# Patient Record
Sex: Male | Born: 1970 | Race: White | Hispanic: No | Marital: Single | Smoking: Former smoker
Health system: Southern US, Community
[De-identification: ages and names within clinical notes are randomized; demographics above are authoritative.]

## PROBLEM LIST (undated history)

## (undated) DIAGNOSIS — I509 Heart failure, unspecified: Secondary | ICD-10-CM

## (undated) HISTORY — DX: Heart failure, unspecified: I50.9

---

## 2017-06-26 ENCOUNTER — Inpatient Hospital Stay (HOSPITAL_COMMUNITY)
Admission: EM | Admit: 2017-06-26 | Discharge: 2017-06-29 | DRG: 286 | Disposition: A | Discharge status: Home / Self Care | Payer: 59 | Attending: Cardiovascular Disease | Admitting: Cardiovascular Disease

## 2017-06-26 ENCOUNTER — Other Ambulatory Visit: Payer: Self-pay

## 2017-06-26 ENCOUNTER — Emergency Department (HOSPITAL_COMMUNITY): Payer: 59

## 2017-06-26 ENCOUNTER — Encounter (HOSPITAL_COMMUNITY): Payer: Self-pay

## 2017-06-26 DIAGNOSIS — R7303 Prediabetes: Secondary | ICD-10-CM | POA: Diagnosis present

## 2017-06-26 DIAGNOSIS — N179 Acute kidney failure, unspecified: Secondary | ICD-10-CM | POA: Diagnosis present

## 2017-06-26 DIAGNOSIS — Z8249 Family history of ischemic heart disease and other diseases of the circulatory system: Secondary | ICD-10-CM

## 2017-06-26 DIAGNOSIS — Z6841 Body Mass Index (BMI) 40.0 and over, adult: Secondary | ICD-10-CM | POA: Diagnosis not present

## 2017-06-26 DIAGNOSIS — I509 Heart failure, unspecified: Secondary | ICD-10-CM | POA: Diagnosis present

## 2017-06-26 DIAGNOSIS — I5021 Acute systolic (congestive) heart failure: Secondary | ICD-10-CM | POA: Diagnosis present

## 2017-06-26 DIAGNOSIS — N182 Chronic kidney disease, stage 2 (mild): Secondary | ICD-10-CM | POA: Diagnosis present

## 2017-06-26 DIAGNOSIS — I130 Hypertensive heart and chronic kidney disease with heart failure and stage 1 through stage 4 chronic kidney disease, or unspecified chronic kidney disease: Secondary | ICD-10-CM | POA: Diagnosis present

## 2017-06-26 DIAGNOSIS — I160 Hypertensive urgency: Secondary | ICD-10-CM | POA: Diagnosis present

## 2017-06-26 DIAGNOSIS — Z87891 Personal history of nicotine dependence: Secondary | ICD-10-CM

## 2017-06-26 DIAGNOSIS — I10 Essential (primary) hypertension: Secondary | ICD-10-CM | POA: Diagnosis present

## 2017-06-26 DIAGNOSIS — R001 Bradycardia, unspecified: Secondary | ICD-10-CM | POA: Diagnosis not present

## 2017-06-26 DIAGNOSIS — R739 Hyperglycemia, unspecified: Secondary | ICD-10-CM | POA: Diagnosis present

## 2017-06-26 DIAGNOSIS — J90 Pleural effusion, not elsewhere classified: Secondary | ICD-10-CM

## 2017-06-26 DIAGNOSIS — I429 Cardiomyopathy, unspecified: Secondary | ICD-10-CM | POA: Diagnosis present

## 2017-06-26 DIAGNOSIS — E7439 Other disorders of intestinal carbohydrate absorption: Secondary | ICD-10-CM | POA: Diagnosis present

## 2017-06-26 DIAGNOSIS — I428 Other cardiomyopathies: Secondary | ICD-10-CM | POA: Diagnosis not present

## 2017-06-26 LAB — CBC
HEMATOCRIT: 42.8 % (ref 39.0–52.0)
Hemoglobin: 14.8 g/dL (ref 13.0–17.0)
MCH: 30.4 pg (ref 26.0–34.0)
MCHC: 34.6 g/dL (ref 30.0–36.0)
MCV: 87.9 fL (ref 78.0–100.0)
Platelets: 284 10*3/uL (ref 150–400)
RBC: 4.87 MIL/uL (ref 4.22–5.81)
RDW: 14.1 % (ref 11.5–15.5)
WBC: 11.3 10*3/uL — ABNORMAL HIGH (ref 4.0–10.5)

## 2017-06-26 LAB — CBC WITH DIFFERENTIAL/PLATELET
Basophils Absolute: 0.1 10*3/uL (ref 0.0–0.1)
Basophils Relative: 1 %
EOS ABS: 0.3 10*3/uL (ref 0.0–0.7)
EOS PCT: 4 %
HCT: 41.4 % (ref 39.0–52.0)
Hemoglobin: 14.1 g/dL (ref 13.0–17.0)
LYMPHS ABS: 3.4 10*3/uL (ref 0.7–4.0)
LYMPHS PCT: 38 %
MCH: 29.8 pg (ref 26.0–34.0)
MCHC: 34.1 g/dL (ref 30.0–36.0)
MCV: 87.5 fL (ref 78.0–100.0)
MONO ABS: 0.8 10*3/uL (ref 0.1–1.0)
MONOS PCT: 9 %
Neutro Abs: 4.4 10*3/uL (ref 1.7–7.7)
Neutrophils Relative %: 48 %
PLATELETS: 290 10*3/uL (ref 150–400)
RBC: 4.73 MIL/uL (ref 4.22–5.81)
RDW: 14.1 % (ref 11.5–15.5)
WBC: 8.9 10*3/uL (ref 4.0–10.5)

## 2017-06-26 LAB — COMPREHENSIVE METABOLIC PANEL
ALK PHOS: 46 U/L (ref 38–126)
ALT: 57 U/L (ref 17–63)
ANION GAP: 10 (ref 5–15)
AST: 31 U/L (ref 15–41)
Albumin: 3.8 g/dL (ref 3.5–5.0)
BUN: 24 mg/dL — ABNORMAL HIGH (ref 6–20)
CALCIUM: 9.2 mg/dL (ref 8.9–10.3)
CHLORIDE: 104 mmol/L (ref 101–111)
CO2: 25 mmol/L (ref 22–32)
CREATININE: 1.26 mg/dL — AB (ref 0.61–1.24)
Glucose, Bld: 119 mg/dL — ABNORMAL HIGH (ref 65–99)
Potassium: 3.6 mmol/L (ref 3.5–5.1)
SODIUM: 139 mmol/L (ref 135–145)
Total Bilirubin: 0.5 mg/dL (ref 0.3–1.2)
Total Protein: 7.1 g/dL (ref 6.5–8.1)

## 2017-06-26 LAB — URINALYSIS, ROUTINE W REFLEX MICROSCOPIC
Bilirubin Urine: NEGATIVE
Glucose, UA: NEGATIVE mg/dL
HGB URINE DIPSTICK: NEGATIVE
Ketones, ur: NEGATIVE mg/dL
Leukocytes, UA: NEGATIVE
NITRITE: NEGATIVE
Protein, ur: NEGATIVE mg/dL
SPECIFIC GRAVITY, URINE: 1.011 (ref 1.005–1.030)
pH: 7 (ref 5.0–8.0)

## 2017-06-26 LAB — GLUCOSE, CAPILLARY
GLUCOSE-CAPILLARY: 100 mg/dL — AB (ref 65–99)
GLUCOSE-CAPILLARY: 105 mg/dL — AB (ref 65–99)
Glucose-Capillary: 110 mg/dL — ABNORMAL HIGH (ref 65–99)

## 2017-06-26 LAB — BRAIN NATRIURETIC PEPTIDE: B NATRIURETIC PEPTIDE 5: 255.4 pg/mL — AB (ref 0.0–100.0)

## 2017-06-26 LAB — CREATININE, SERUM
Creatinine, Ser: 1.35 mg/dL — ABNORMAL HIGH (ref 0.61–1.24)
GFR calc Af Amer: >60 mL/min (ref >60)

## 2017-06-26 MED ORDER — AMLODIPINE BESYLATE 10 MG PO TABS
10.0000 mg | ORAL_TABLET | Freq: Every day | ORAL | Status: DC
  Administered 2017-06-26 – 2017-06-29 (×4): 10 mg via ORAL
  Filled 2017-06-26 (×4): qty 1

## 2017-06-26 MED ORDER — SODIUM CHLORIDE 0.9% FLUSH
3.0000 mL | INTRAVENOUS | Status: DC | PRN
Start: 2017-06-26 — End: 2017-06-29

## 2017-06-26 MED ORDER — MAGNESIUM SULFATE 2 GM/50ML IV SOLN
2.0000 g | Freq: Once | INTRAVENOUS | Status: AC
  Administered 2017-06-26: 2 g via INTRAVENOUS
  Filled 2017-06-26: qty 50

## 2017-06-26 MED ORDER — ACETAMINOPHEN 325 MG PO TABS
650.0000 mg | ORAL_TABLET | ORAL | Status: DC | PRN
  Administered 2017-06-26: 650 mg via ORAL
  Filled 2017-06-26: qty 2

## 2017-06-26 MED ORDER — DM-GUAIFENESIN ER 30-600 MG PO TB12: 1.0000 | ORAL_TABLET | Freq: Two times a day (BID) | ORAL | Status: DC | PRN

## 2017-06-26 MED ORDER — ONDANSETRON HCL 4 MG/2ML IJ SOLN: 4.0000 mg | Freq: Three times a day (TID) | INTRAMUSCULAR | Status: DC | PRN

## 2017-06-26 MED ORDER — ASPIRIN EC 81 MG PO TBEC
81.0000 mg | DELAYED_RELEASE_TABLET | Freq: Every day | ORAL | Status: DC
  Administered 2017-06-26 – 2017-06-29 (×4): 81 mg via ORAL
  Filled 2017-06-26 (×4): qty 1

## 2017-06-26 MED ORDER — ENOXAPARIN SODIUM 40 MG/0.4ML ~~LOC~~ SOLN
40.0000 mg | SUBCUTANEOUS | Status: DC
  Administered 2017-06-27 – 2017-06-29 (×2): 40 mg via SUBCUTANEOUS
  Filled 2017-06-26 (×2): qty 0.4

## 2017-06-26 MED ORDER — IPRATROPIUM BROMIDE 0.02 % IN SOLN
RESPIRATORY_TRACT | Status: AC
  Filled 2017-06-26: qty 2.5

## 2017-06-26 MED ORDER — ALBUTEROL SULFATE (2.5 MG/3ML) 0.083% IN NEBU: 2.5000 mg | INHALATION_SOLUTION | RESPIRATORY_TRACT | Status: DC | PRN

## 2017-06-26 MED ORDER — SODIUM CHLORIDE 0.9 % IV SOLN: 250.0000 mL | INTRAVENOUS | Status: DC | PRN

## 2017-06-26 MED ORDER — SODIUM CHLORIDE 0.9% FLUSH
3.0000 mL | Freq: Two times a day (BID) | INTRAVENOUS | Status: DC
  Administered 2017-06-26 – 2017-06-28 (×6): 3 mL via INTRAVENOUS

## 2017-06-26 MED ORDER — ALBUTEROL SULFATE (2.5 MG/3ML) 0.083% IN NEBU
5.0000 mg | INHALATION_SOLUTION | Freq: Once | RESPIRATORY_TRACT | Status: AC
  Administered 2017-06-26: 5 mg via RESPIRATORY_TRACT
  Filled 2017-06-26: qty 6

## 2017-06-26 MED ORDER — INSULIN ASPART 100 UNIT/ML ~~LOC~~ SOLN: 0.0000 [IU] | Freq: Three times a day (TID) | SUBCUTANEOUS | Status: DC

## 2017-06-26 MED ORDER — ALBUTEROL (5 MG/ML) CONTINUOUS INHALATION SOLN
INHALATION_SOLUTION | RESPIRATORY_TRACT | Status: AC
  Filled 2017-06-26: qty 1

## 2017-06-26 MED ORDER — HYDRALAZINE HCL 20 MG/ML IJ SOLN
20.0000 mg | Freq: Once | INTRAMUSCULAR | Status: AC
  Administered 2017-06-26: 20 mg via INTRAVENOUS
  Filled 2017-06-26: qty 1

## 2017-06-26 MED ORDER — FUROSEMIDE 10 MG/ML IJ SOLN
80.0000 mg | Freq: Once | INTRAMUSCULAR | Status: AC
  Administered 2017-06-26: 80 mg via INTRAVENOUS
  Filled 2017-06-26: qty 8

## 2017-06-26 MED ORDER — POTASSIUM CHLORIDE CRYS ER 10 MEQ PO TBCR
10.0000 meq | EXTENDED_RELEASE_TABLET | Freq: Two times a day (BID) | ORAL | Status: DC
  Administered 2017-06-26 – 2017-06-29 (×7): 10 meq via ORAL
  Filled 2017-06-26 (×7): qty 1

## 2017-06-26 MED ORDER — LISINOPRIL 5 MG PO TABS
5.0000 mg | ORAL_TABLET | Freq: Every day | ORAL | Status: DC
  Administered 2017-06-26: 5 mg via ORAL
  Filled 2017-06-26: qty 1

## 2017-06-26 MED ORDER — FUROSEMIDE 10 MG/ML IJ SOLN
40.0000 mg | Freq: Two times a day (BID) | INTRAMUSCULAR | Status: DC
  Administered 2017-06-26 – 2017-06-29 (×6): 40 mg via INTRAVENOUS
  Filled 2017-06-26 (×7): qty 4

## 2017-06-26 MED ORDER — CARVEDILOL 3.125 MG PO TABS
3.1250 mg | ORAL_TABLET | Freq: Two times a day (BID) | ORAL | Status: DC
  Administered 2017-06-26 – 2017-06-29 (×6): 3.125 mg via ORAL
  Filled 2017-06-26 (×6): qty 1

## 2017-06-26 MED ORDER — HYDRALAZINE HCL 20 MG/ML IJ SOLN: 5.0000 mg | INTRAMUSCULAR | Status: DC | PRN

## 2017-06-26 NOTE — Progress Notes (Signed)
This is a no charge note  Pending admission per Dr. Betsey Holiday  47 year old man with past medical history only significant for obesity, who presents with SOB for 10 days. Patient has cough and congestion. He took a course of Z-Pak without improvement. Denies chest pain. Patient has bilateral leg edema.  Patient was found to have BNP 255, blood pressure 179/105, acute renal injury with creatinine 1.26, chest x-ray showed pulmonary edema, clinically consistent with acute onset CHF. Patient may have undiagnosed hypertension. 80 mg Lasix was given. Started patient with amlodipine.   Ivor Costa, MD  [facility] Pager [phone]  If 7PM-7AM, please contact night-coverage [email or web address] Password [email or web address] 06/26/2017, 6:12 AM

## 2017-06-26 NOTE — ED Triage Notes (Addendum)
Pt. Complains of SOB for a week and a half. Pt. Reports thinking it was a cold and was prescribed a zpack. Pt. States he laid down to fall asleep and felt like he couldn't breathe. Pt. Reports bilateral lower extremity swelling.

## 2017-06-26 NOTE — ED Notes (Signed)
Pt. Return from XRAY via stretcher. 

## 2017-06-26 NOTE — ED Notes (Addendum)
Pt. States breathing treatment has made him feel better. Pt.  recent BP 179/105. MD notified.

## 2017-06-26 NOTE — ED Notes (Signed)
Pt. Started gasping for air and claimed he wasn't able to breathe. Put pt. On side of bed and started on 2 L. MD notified.

## 2017-06-26 NOTE — ED Notes (Signed)
Pt. To XRAY via stretcher. 

## 2017-06-26 NOTE — H&P (Signed)
History and Physical    Alvin Levy NID:[id number] DOB: 12-Jun-1971 DOA: 06/26/2017  PCP: Patient, No Pcp Per   Patient coming from: Home  Patient has no previous records in our system.  Chief Complaint: Acute severe shortness of breath for 1-1/2 weeks worsen this morning.  HPI: Alvin Levy is a 46 y.o. male with medical history significant for morbid obesity who presents to the emergency department complaining of severe shortness of breath. Patient has been short of breath for approximately a week. He had some cough and congestion which got worse. He contacted a telemedicine provider through his insurance company and was prescribed a Z-Pak. Improvement with the Z-Pak but in the last day or so has had increasing shortness of breath. He has decreased exercise tolerance, he is very short of breath with minimal exertion. He is unable to lie flat. He has paroxysmal nocturnal dyspnea. Patient arrived to our emergency department via ambulance. He has no associated chest pain. He denies any palpitations. He does have some new lower extremity edema.  Patient has never had a problem like this before. In the emergency department he was noted to be markedly hypertensive and he states that his blood pressure has always been slightly low. He denies any headache or blurry vision he has had no signs of high blood pressure including chest pain or pressure. He does complain of increased swelling of the legs and feet since the shortness of breath began. He has had severe dyspnea on exertion.  ED Course: In the emergency department patient was treated with IV Lasix with a 1400 mL diuresis. At the time I saw him he was feeling somewhat better. He'll be admitted into the hospital for further evaluation and management of new onset congestive heart failure.   Review of Systems  Constitutional: Negative for chills and fever.  HENT: Negative for congestion, nosebleeds and sinus pain.   Eyes: Negative for blurred vision and  double vision.  Respiratory: Positive for cough and shortness of breath. Negative for hemoptysis, sputum production and wheezing.   Cardiovascular: Positive for orthopnea, leg swelling and PND. Negative for chest pain, palpitations and claudication.  Gastrointestinal: Negative for abdominal pain, heartburn, nausea and vomiting.  Genitourinary: Negative for dysuria and urgency.  Musculoskeletal: Negative for falls, myalgias and neck pain.  Skin: Negative for itching and rash.  Neurological: Negative for dizziness, tingling, tremors, speech change, focal weakness, seizures, weakness and headaches.  Endo/Heme/Allergies: Negative for environmental allergies. Does not bruise/bleed easily.  Psychiatric/Behavioral: Negative for depression and suicidal ideas.   History reviewed. No pertinent past medical history.  History reviewed. No pertinent surgical history.   reports that he has quit smoking. He has quit using smokeless tobacco. He reports that he does not drink alcohol or use drugs.  No Known Allergies  Family History  Problem Relation Age of Onset  . Heart disease Mother   . HIV/AIDS Father      Prior to Admission medications   Medication Sig Start Date End Date Taking? Authorizing Provider  ibuprofen (ADVIL,MOTRIN) 200 MG tablet Take 200 mg every 6 (six) hours as needed by mouth for fever, headache or mild pain.   Yes [provider]    Physical Exam: Vitals:   06/26/17 0600 06/26/17 0645 06/26/17 0715 06/26/17 0836  BP: (!) 165/83 (!) 160/95 140/82 (!) 140/121  Pulse: 100 99 95 (!) 106  Resp: 19   18  Temp:    97.7 F (36.5 C)  TempSrc:      SpO2:  100% 97% 97% 97%  Weight:    (!) 147.8 kg (325 lb 14.4 oz)  Height:    6\' 4"  (1.93 m)    Constitutional: NAD, calm, comfortable Vitals:   06/26/17 0600 06/26/17 0645 06/26/17 0715 06/26/17 0836  BP: (!) 165/83 (!) 160/95 140/82 (!) 140/121  Pulse: 100 99 95 (!) 106  Resp: 19   18  Temp:    97.7 F (36.5 C)    TempSrc:      SpO2: 100% 97% 97% 97%  Weight:    (!) 147.8 kg (325 lb 14.4 oz)  Height:    6\' 4"  (1.93 m)   Eyes: PERRL, lids and conjunctivae normal ENMT: Mucous membranes are moist. Posterior pharynx clear of any exudate or lesions.Normal dentition.  Neck: normal, supple, no masses, no thyromegaly, no JVD or JVP. Respiratory: Coarse breath sounds bilaterally, minimal wheezing, crackles halfway up lung fields. Increased respiratory effort. No accessory muscle use.  Cardiovascular: Regular rate and rhythm, no murmurs / rubs / gallops. extremity edema 2+ to halfway up the calves. 2+ pedal pulses. No carotid bruits.  Abdomen: no tenderness, no masses palpated. No hepatosplenomegaly. Bowel sounds positive.  Musculoskeletal: no clubbing / cyanosis. No joint deformity upper and lower extremities. Good ROM, no contractures. Normal muscle tone.  Skin: no rashes, lesions, ulcers. No induration Neurologic: CN 2-12 grossly intact. Sensation intact, DTR normal. Strength 5/5 in all 4.  Psychiatric: Normal judgment and insight. Alert and oriented x 3. Normal mood.     Labs on Admission: I have personally reviewed following labs and imaging studies  CBC: Recent Labs  Lab 06/26/17 0304 06/26/17 0852  WBC 8.9 11.3*  NEUTROABS 4.4  --   HGB 14.1 14.8  HCT 41.4 42.8  MCV 87.5 87.9  PLT 290 371   Basic Metabolic Panel: Recent Labs  Lab 06/26/17 0304 06/26/17 0852  NA 139  --   K 3.6  --   CL 104  --   CO2 25  --   GLUCOSE 119*  --   BUN 24*  --   CREATININE 1.26* 1.35*  CALCIUM 9.2  --    GFR: Estimated Creatinine Clearance: 107.5 mL/min (A) (by C-G formula based on SCr of 1.35 mg/dL (H)). Liver Function Tests: Recent Labs  Lab 06/26/17 0304  AST 31  ALT 57  ALKPHOS 46  BILITOT 0.5  PROT 7.1  ALBUMIN 3.8   No results for input(s): LIPASE, AMYLASE in the last 168 hours. No results for input(s): AMMONIA in the last 168 hours. Coagulation Profile: No results for input(s):  INR, PROTIME in the last 168 hours. Cardiac Enzymes: No results for input(s): CKTOTAL, CKMB, CKMBINDEX, TROPONINI in the last 168 hours. BNP (last 3 results) No results for input(s): PROBNP in the last 8760 hours. HbA1C: No results for input(s): HGBA1C in the last 72 hours. CBG: Recent Labs  Lab 06/26/17 1159  GLUCAP 110*   Lipid Profile: No results for input(s): CHOL, HDL, LDLCALC, TRIG, CHOLHDL, LDLDIRECT in the last 72 hours. Thyroid Function Tests: No results for input(s): TSH, T4TOTAL, FREET4, T3FREE, THYROIDAB in the last 72 hours. Anemia Panel: No results for input(s): VITAMINB12, FOLATE, FERRITIN, TIBC, IRON, RETICCTPCT in the last 72 hours. Urine analysis: No results found for: COLORURINE, APPEARANCEUR, LABSPEC, PHURINE, GLUCOSEU, HGBUR, BILIRUBINUR, KETONESUR, PROTEINUR, UROBILINOGEN, NITRITE, LEUKOCYTESUR  Radiological Exams on Admission: Dg Chest 2 View  Result Date: 06/26/2017 CLINICAL DATA:  Shortness of breath. EXAM: CHEST  2 VIEW COMPARISON:  None. FINDINGS: Mild cardiomegaly.  Moderate pulmonary edema and bronchial thickening. Small right pleural effusion. No confluent airspace disease. No pneumothorax. No acute osseous abnormalities. IMPRESSION: Cardiomegaly with pulmonary edema and small right pleural effusion consistent with CHF. Electronically Signed   By: Jeb Levering M.D.   On: 06/26/2017 04:44    EKG: Independently reviewed. Sinus tachycardia with nonspecific ST-T wave abnormality and a prolonged QT no prior is available for comparison.  Assessment/Plan Principal Problem:   Acute CHF (congestive heart failure) (HCC) Active Problems:   Accelerated hypertension   Obesity, Class III, BMI 40-49.9 (morbid obesity) ([place])   AKI (acute kidney injury) ([place])   Hyperglycemia  1. Acute congestive heart failure: Patient will be admitted into the hospital for new onset acute congestive heart failure. He will need extensive evaluation and teaching. Start Lasix 40 mg  IV every 12 hours. Monitor renal function potassium and magnesium closely. Will begin a very low dose ACE inhibitor and beta blocker. We will check daily weights and start the patient on a low salt cardiac diet with a 1500 mL fluid restriction. Patient will receive CHF education as well as follow-up with cardiologist. He will also need to be established with a primary care physician.  2. Accelerated hypertension: Patient's blood pressures are markedly elevated and consistent with accelerated or emergent hypertension. Is likely the cause of his congestive heart failure. He was given a dose of amlodipine. Will continue amlodipine in addition to low-dose carvedilol and low-dose lisinopril 5 mg daily. Hydralazine has been ordered as needed for systolic blood pressures greater than 175.  3. Obesity class III with a BMI of 19-75.8: Patient will certainly need to get on a weight loss plan. He would benefit from a anti-inflammatory diet. Nutrition will see him and discuss with him at their consultation regarding his heart failure diet.  4. Acute kidney injury: In the face of accelerated hypertension with evidence of acute congestive heart failure this is very concerning. We'll check a urinalysis to rule out any acute kidney issues that can be reversed. We'll avoid nephrotoxic agents.  5. Hyperglycemia: Patient with moderately elevated blood glucose fasting of 119. He has marked morbid obesity and is at risk for diabetes based on his body surface area. Will check fingerstick blood glucoses before meals and at bedtime and cover with sliding scale if necessary. I will check a hemoglobin A1c.   DVT prophylaxis: Lovenox Code Status: Full code Family Communication: Patient's awake alert and has capacity to make decisions. No family present at the time of admission. Disposition Plan: Likely home in 48-72 hours Consults called: None Admission status: Inpatient   Lady Deutscher MD FACP [facility] Pager [phone]  If 7PM-7AM, please contact night-coverage [email or web address] Password [email or web address]  06/26/2017, 12:06 PM

## 2017-06-26 NOTE — ED Provider Notes (Addendum)
[facility] EMERGENCY DEPARTMENT Provider Note   CSN: [id number] Arrival date & time: 06/26/17  0626     History   Chief Complaint Chief Complaint  Patient presents with  . Shortness of Breath    HPI Alvin Levy is a 46 y.o. male.  Patient presents to the emergency department for evaluation of shortness of breath.  Patient reports that he has been experiencing shortness of breath for approximately a week.  He started with some cough and congestion.  He contacted a telemedicine provider and was prescribed a Z-Pak.  He reports that he had some improvement with the Z-Pak, but in the last day or so he has started having increased shortness of breath.  He has noticed that he has decreased exercise tolerance, gets very short of breath with minimal exertion.  He has brought to the ER from home by ambulance.  Patient denies associated chest pain.      History reviewed. No pertinent past medical history.  There are no active problems to display for this patient.   History reviewed. No pertinent surgical history.     Home Medications    Prior to Admission medications   Medication Sig Start Date End Date Taking? Authorizing Provider  ibuprofen (ADVIL,MOTRIN) 200 MG tablet Take 200 mg every 6 (six) hours as needed by mouth for fever, headache or mild pain.   Yes [provider]    Family History History reviewed. No pertinent family history.  Social History Social History   Tobacco Use  . Smoking status: Former Research scientist (life sciences)  . Smokeless tobacco: Former Network engineer Use Topics  . Alcohol use: Not on file  . Drug use: Not on file     Allergies   Patient has no known allergies.   Review of Systems Review of Systems  Respiratory: Positive for cough and shortness of breath.   All other systems reviewed and are negative.    Physical Exam Updated Vital Signs BP (!) 175/119   Pulse 93   Temp 98.3 F (36.8 C) (Oral)   Resp 19   Ht 6\' 4"   (1.93 m)   Wt 136.1 kg (300 lb)   SpO2 98%   BMI 36.52 kg/m   Physical Exam  Constitutional: He is oriented to person, place, and time. He appears well-developed and well-nourished. No distress.  HENT:  Head: Normocephalic and atraumatic.  Right Ear: Hearing normal.  Left Ear: Hearing normal.  Nose: Nose normal.  Mouth/Throat: Oropharynx is clear and moist and mucous membranes are normal.  Eyes: Conjunctivae and EOM are normal. Pupils are equal, round, and reactive to light.  Neck: Normal range of motion. Neck supple.  Cardiovascular: Regular rhythm, S1 normal and S2 normal. Exam reveals no gallop and no friction rub.  No murmur heard. Pulmonary/Chest: Effort normal. No respiratory distress. He has decreased breath sounds. He has wheezes. He exhibits no tenderness.  Abdominal: Soft. Normal appearance and bowel sounds are normal. There is no hepatosplenomegaly. There is no tenderness. There is no rebound, no guarding, no tenderness at McBurney's point and negative Murphy's sign. No hernia.  Musculoskeletal: Normal range of motion.  Neurological: He is alert and oriented to person, place, and time. He has normal strength. No cranial nerve deficit or sensory deficit. Coordination normal. GCS eye subscore is 4. GCS verbal subscore is 5. GCS motor subscore is 6.  Skin: Skin is warm, dry and intact. No rash noted. No cyanosis.  Psychiatric: He has a normal mood and affect.  His speech is normal and behavior is normal. Thought content normal.  Nursing note and vitals reviewed.    ED Treatments / Results  Labs (all labs ordered are listed, but only abnormal results are displayed) Labs Reviewed  COMPREHENSIVE METABOLIC PANEL - Abnormal; Notable for the following components:      Result Value   Glucose, Bld 119 (*)    BUN 24 (*)    Creatinine, Ser 1.26 (*)    All other components within normal limits  BRAIN NATRIURETIC PEPTIDE - Abnormal; Notable for the following components:   B  Natriuretic Peptide 255.4 (*)    All other components within normal limits  CBC WITH DIFFERENTIAL/PLATELET    EKG  EKG Interpretation  Date/Time:  Sunday June 26 2017 06:03:04 EST Ventricular Rate:  109 PR Interval:    QRS Duration: 98 QT Interval:  353 QTC Calculation: 476 R Axis:   60 Text Interpretation:  Sinus tachycardia Nonspecific T abnormalities, lateral leads Borderline prolonged QT interval Baseline wander in lead(s) I II aVR No previous tracing Confirmed by Orpah Greek [phone]) on 06/26/2017 6:04:51 AM       Radiology Dg Chest 2 View  Result Date: 06/26/2017 CLINICAL DATA:  Shortness of breath. EXAM: CHEST  2 VIEW COMPARISON:  None. FINDINGS: Mild cardiomegaly. Moderate pulmonary edema and bronchial thickening. Small right pleural effusion. No confluent airspace disease. No pneumothorax. No acute osseous abnormalities. IMPRESSION: Cardiomegaly with pulmonary edema and small right pleural effusion consistent with CHF. Electronically Signed   By: Jeb Levering M.D.   On: 06/26/2017 04:44    Procedures Procedures (including critical care time)  Medications Ordered in ED Medications  albuterol (PROVENTIL, VENTOLIN) (5 MG/ML) 0.5% continuous inhalation solution (not administered)  ipratropium (ATROVENT) 0.02 % nebulizer solution (not administered)  albuterol (PROVENTIL) (2.5 MG/3ML) 0.083% nebulizer solution 5 mg (5 mg Nebulization Given 06/26/17 0558)  furosemide (LASIX) injection 80 mg (80 mg Intravenous Given 06/26/17 0550)  hydrALAZINE (APRESOLINE) injection 20 mg (20 mg Intravenous Given 06/26/17 0550)     Initial Impression / Assessment and Plan / ED Course  I have reviewed the triage vital signs and the nursing notes.  Pertinent labs & imaging results that were available during my care of the patient were reviewed by me and considered in my medical decision making (see chart for details).     Patient presents to the emergency department for  evaluation of shortness of breath.  Patient reports that he has been experiencing progressively worsening shortness of breath.  He was recently treated for upper respiratory infection with a Z-Pak but did not improve much.  At arrival patient is noted to be very hypertensive.  He reports that his blood pressure in the past has always been slightly low.  He has not had any headaches, blurred vision, chest pain or other warning signs of high blood pressure.  He has, however, noticed increased swelling of his legs and feet since the shortness of breath began.  He is experiencing severe dyspnea on exertion as well as orthopnea.  His workup is suggestive of congestive heart failure.  He was initiated on Lasix.  Blood pressure is very elevated, likely hypertensive urgency.  Treat with hydralazine.  Patient will be hospitalized he has becomes very dyspneic and hypoxic with minimal exertion.  Final Clinical Impressions(s) / ED Diagnoses   Final diagnoses:  Acute congestive heart failure, unspecified heart failure type [facility])    New Prescriptions     Orpah Greek, MD 06/26/17  0500    Orpah Greek, MD 06/26/17 [phone]

## 2017-06-27 ENCOUNTER — Inpatient Hospital Stay (HOSPITAL_COMMUNITY): Payer: 59

## 2017-06-27 DIAGNOSIS — N182 Chronic kidney disease, stage 2 (mild): Secondary | ICD-10-CM

## 2017-06-27 DIAGNOSIS — I160 Hypertensive urgency: Secondary | ICD-10-CM

## 2017-06-27 DIAGNOSIS — I509 Heart failure, unspecified: Secondary | ICD-10-CM

## 2017-06-27 DIAGNOSIS — I5021 Acute systolic (congestive) heart failure: Secondary | ICD-10-CM

## 2017-06-27 DIAGNOSIS — I428 Other cardiomyopathies: Secondary | ICD-10-CM

## 2017-06-27 LAB — BASIC METABOLIC PANEL
ANION GAP: 10 (ref 5–15)
BUN: 23 mg/dL — AB (ref 6–20)
CALCIUM: 9.2 mg/dL (ref 8.9–10.3)
CO2: 29 mmol/L (ref 22–32)
Chloride: 102 mmol/L (ref 101–111)
Creatinine, Ser: 1.32 mg/dL — ABNORMAL HIGH (ref 0.61–1.24)
GFR calc Af Amer: >60 mL/min (ref >60)
Glucose, Bld: 118 mg/dL — ABNORMAL HIGH (ref 65–99)
POTASSIUM: 4.2 mmol/L (ref 3.5–5.1)
SODIUM: 141 mmol/L (ref 135–145)

## 2017-06-27 LAB — GLUCOSE, CAPILLARY
GLUCOSE-CAPILLARY: 85 mg/dL (ref 65–99)
Glucose-Capillary: 104 mg/dL — ABNORMAL HIGH (ref 65–99)

## 2017-06-27 LAB — ECHOCARDIOGRAM COMPLETE
Height: 76 in
WEIGHTICAEL: 5123.2 [oz_av]

## 2017-06-27 LAB — HIV ANTIBODY (ROUTINE TESTING W REFLEX): HIV Screen 4th Generation wRfx: NONREACTIVE

## 2017-06-27 LAB — HEMOGLOBIN A1C
Hgb A1c MFr Bld: 5.9 % — ABNORMAL HIGH (ref 4.8–5.6)
MEAN PLASMA GLUCOSE: 122.63 mg/dL

## 2017-06-27 LAB — TSH: TSH: 3.123 u[IU]/mL (ref 0.350–4.500)

## 2017-06-27 LAB — MAGNESIUM: MAGNESIUM: 2.3 mg/dL (ref 1.7–2.4)

## 2017-06-27 MED ORDER — SODIUM CHLORIDE 0.9% FLUSH: 3.0000 mL | INTRAVENOUS | Status: DC | PRN

## 2017-06-27 MED ORDER — SODIUM CHLORIDE 0.9 % IV SOLN
INTRAVENOUS | Status: DC
  Administered 2017-06-27: 15:00:00 via INTRAVENOUS

## 2017-06-27 MED ORDER — PERFLUTREN LIPID MICROSPHERE
1.0000 mL | INTRAVENOUS | Status: AC | PRN
  Administered 2017-06-27: 2 mL via INTRAVENOUS
  Filled 2017-06-27: qty 10

## 2017-06-27 MED ORDER — SODIUM CHLORIDE 0.9% FLUSH
3.0000 mL | Freq: Two times a day (BID) | INTRAVENOUS | Status: DC
  Administered 2017-06-27 – 2017-06-28 (×2): 3 mL via INTRAVENOUS

## 2017-06-27 MED ORDER — SODIUM CHLORIDE 0.9 % IV SOLN: 250.0000 mL | INTRAVENOUS | Status: DC | PRN

## 2017-06-27 NOTE — Progress Notes (Signed)
The on call MD called back and recommended that the morning MD will re-evaluate his betablocker before the next administration.

## 2017-06-27 NOTE — Consult Note (Signed)
Referring Physician:  [name] is an 46 y.o. male.                       Chief Complaint: Shortness of breath  HPI: 46 year old male presented with 2 week h/o of exertional shortness of breath. He denies chest pain. His  BNP is mildly elevated. His chest x-ray shows pulmonary edema. His LV function on echocardiogram done today shows wall motion abnormality with EF 35 %. He has h/o of untreated hypertension and has h/o prior smoking.  History reviewed. No pertinent past medical history.    History reviewed. No pertinent surgical history.  Family History  Problem Relation Age of Onset  . Heart disease Mother   . HIV/AIDS Father    Social History:  reports that he has quit smoking. He has quit using smokeless tobacco. He reports that he does not drink alcohol or use drugs.  Allergies: No Known Allergies  Medications Prior to Admission  Medication Sig Dispense Refill  . ibuprofen (ADVIL,MOTRIN) 200 MG tablet Take 200 mg every 6 (six) hours as needed by mouth for fever, headache or mild pain.      Results for orders placed or performed during the hospital encounter of 06/26/17 (from the past 48 hour(s))  CBC with Differential/Platelet     Status: None   Collection Time: 06/26/17  3:04 AM  Result Value Ref Range   WBC 8.9 4.0 - 10.5 K/uL   RBC 4.73 4.22 - 5.81 MIL/uL   Hemoglobin 14.1 13.0 - 17.0 g/dL   HCT 41.4 39.0 - 52.0 %   MCV 87.5 78.0 - 100.0 fL   MCH 29.8 26.0 - 34.0 pg   MCHC 34.1 30.0 - 36.0 g/dL   RDW 14.1 11.5 - 15.5 %   Platelets 290 150 - 400 K/uL   Neutrophils Relative % 48 %   Neutro Abs 4.4 1.7 - 7.7 K/uL   Lymphocytes Relative 38 %   Lymphs Abs 3.4 0.7 - 4.0 K/uL   Monocytes Relative 9 %   Monocytes Absolute 0.8 0.1 - 1.0 K/uL   Eosinophils Relative 4 %   Eosinophils Absolute 0.3 0.0 - 0.7 K/uL   Basophils Relative 1 %   Basophils Absolute 0.1 0.0 - 0.1 K/uL  Comprehensive metabolic panel     Status: Abnormal   Collection Time: 06/26/17  3:04 AM   Result Value Ref Range   Sodium 139 135 - 145 mmol/L   Potassium 3.6 3.5 - 5.1 mmol/L   Chloride 104 101 - 111 mmol/L   CO2 25 22 - 32 mmol/L   Glucose, Bld 119 (H) 65 - 99 mg/dL   BUN 24 (H) 6 - 20 mg/dL   Creatinine, Ser 1.26 (H) 0.61 - 1.24 mg/dL   Calcium 9.2 8.9 - 10.3 mg/dL   Total Protein 7.1 6.5 - 8.1 g/dL   Albumin 3.8 3.5 - 5.0 g/dL   AST 31 15 - 41 U/L   ALT 57 17 - 63 U/L   Alkaline Phosphatase 46 38 - 126 U/L   Total Bilirubin 0.5 0.3 - 1.2 mg/dL   GFR calc non Af Amer >60 >60 mL/min   GFR calc Af Amer >60 >60 mL/min    Comment: (NOTE) The eGFR has been calculated using the CKD EPI equation. This calculation has not been validated in all clinical situations. eGFR's persistently <60 mL/min signify possible Chronic Kidney Disease.    Anion gap 10 5 - 15  Brain  natriuretic peptide     Status: Abnormal   Collection Time: 06/26/17  3:40 AM  Result Value Ref Range   B Natriuretic Peptide 255.4 (H) 0.0 - 100.0 pg/mL  HIV antibody (Routine Testing)     Status: None   Collection Time: 06/26/17  8:52 AM  Result Value Ref Range   HIV Screen 4th Generation wRfx Non Reactive Non Reactive    Comment: (NOTE) Performed At: [facility] [place], [place] [id number] Rush Farmer MD HY:[id number]   CBC     Status: Abnormal   Collection Time: 06/26/17  8:52 AM  Result Value Ref Range   WBC 11.3 (H) 4.0 - 10.5 K/uL   RBC 4.87 4.22 - 5.81 MIL/uL   Hemoglobin 14.8 13.0 - 17.0 g/dL   HCT 42.8 39.0 - 52.0 %   MCV 87.9 78.0 - 100.0 fL   MCH 30.4 26.0 - 34.0 pg   MCHC 34.6 30.0 - 36.0 g/dL   RDW 14.1 11.5 - 15.5 %   Platelets 284 150 - 400 K/uL  Creatinine, serum     Status: Abnormal   Collection Time: 06/26/17  8:52 AM  Result Value Ref Range   Creatinine, Ser 1.35 (H) 0.61 - 1.24 mg/dL   GFR calc non Af Amer >60 >60 mL/min   GFR calc Af Amer >60 >60 mL/min    Comment: (NOTE) The eGFR has been calculated using the CKD EPI equation. This  calculation has not been validated in all clinical situations. eGFR's persistently <60 mL/min signify possible Chronic Kidney Disease.   Glucose, capillary     Status: Abnormal   Collection Time: 06/26/17 11:59 AM  Result Value Ref Range   Glucose-Capillary 110 (H) 65 - 99 mg/dL   Comment 1 Notify RN    Comment 2 Document in Chart   Urinalysis, Routine w reflex microscopic     Status: Abnormal   Collection Time: 06/26/17  1:27 PM  Result Value Ref Range   Color, Urine STRAW (A) YELLOW   APPearance CLEAR CLEAR   Specific Gravity, Urine 1.011 1.005 - 1.030   pH 7.0 5.0 - 8.0   Glucose, UA NEGATIVE NEGATIVE mg/dL   Hgb urine dipstick NEGATIVE NEGATIVE   Bilirubin Urine NEGATIVE NEGATIVE   Ketones, ur NEGATIVE NEGATIVE mg/dL   Protein, ur NEGATIVE NEGATIVE mg/dL   Nitrite NEGATIVE NEGATIVE   Leukocytes, UA NEGATIVE NEGATIVE  Glucose, capillary     Status: Abnormal   Collection Time: 06/26/17  4:35 PM  Result Value Ref Range   Glucose-Capillary 100 (H) 65 - 99 mg/dL   Comment 1 Notify RN    Comment 2 Document in Chart   Glucose, capillary     Status: Abnormal   Collection Time: 06/26/17  9:07 PM  Result Value Ref Range   Glucose-Capillary 105 (H) 65 - 99 mg/dL  Basic metabolic panel     Status: Abnormal   Collection Time: 06/27/17  3:10 AM  Result Value Ref Range   Sodium 141 135 - 145 mmol/L   Potassium 4.2 3.5 - 5.1 mmol/L   Chloride 102 101 - 111 mmol/L   CO2 29 22 - 32 mmol/L   Glucose, Bld 118 (H) 65 - 99 mg/dL   BUN 23 (H) 6 - 20 mg/dL   Creatinine, Ser 1.32 (H) 0.61 - 1.24 mg/dL   Calcium 9.2 8.9 - 10.3 mg/dL   GFR calc non Af Amer >60 >60 mL/min   GFR calc Af Amer >60 >60 mL/min  Comment: (NOTE) The eGFR has been calculated using the CKD EPI equation. This calculation has not been validated in all clinical situations. eGFR's persistently <60 mL/min signify possible Chronic Kidney Disease.    Anion gap 10 5 - 15  Magnesium     Status: None   Collection Time:  06/27/17  3:10 AM  Result Value Ref Range   Magnesium 2.3 1.7 - 2.4 mg/dL  Glucose, capillary     Status: Abnormal   Collection Time: 06/27/17  7:47 AM  Result Value Ref Range   Glucose-Capillary 104 (H) 65 - 99 mg/dL   Comment 1 Notify RN    Comment 2 Document in Chart   Hemoglobin A1c     Status: Abnormal   Collection Time: 06/27/17  9:11 AM  Result Value Ref Range   Hgb A1c MFr Bld 5.9 (H) 4.8 - 5.6 %    Comment: (NOTE) Pre diabetes:          5.7%-6.4% Diabetes:              >6.4% Glycemic control for   <7.0% adults with diabetes    Mean Plasma Glucose 122.63 mg/dL  TSH     Status: None   Collection Time: 06/27/17  9:11 AM  Result Value Ref Range   TSH 3.123 0.350 - 4.500 uIU/mL    Comment: Performed by a 3rd Generation assay with a functional sensitivity of <=0.01 uIU/mL.  Glucose, capillary     Status: None   Collection Time: 06/27/17 11:44 AM  Result Value Ref Range   Glucose-Capillary 85 65 - 99 mg/dL   Comment 1 Notify RN    Comment 2 Document in Chart    Dg Chest 2 View  Result Date: 06/26/2017 CLINICAL DATA:  Shortness of breath. EXAM: CHEST  2 VIEW COMPARISON:  None. FINDINGS: Mild cardiomegaly. Moderate pulmonary edema and bronchial thickening. Small right pleural effusion. No confluent airspace disease. No pneumothorax. No acute osseous abnormalities. IMPRESSION: Cardiomegaly with pulmonary edema and small right pleural effusion consistent with CHF. Electronically Signed   By: Jeb Levering M.D.   On: 06/26/2017 04:44   Dg Chest Port 1 View  Result Date: 06/27/2017 CLINICAL DATA:  Shortness of breath. EXAM: PORTABLE CHEST 1 VIEW COMPARISON:  Radiographs of June 26, 2017. FINDINGS: The heart size and mediastinal contours are within normal limits. No pneumothorax or pleural effusion is noted. Mild bilateral pulmonary edema is noted which is improved compared to prior exam. The visualized skeletal structures are unremarkable. IMPRESSION: Improved bilateral  pulmonary edema. Electronically Signed   By: Marijo Conception, M.D.   On: 06/27/2017 07:59    Review Of Systems Constitutional: No fever, chills, weight loss or gain. Eyes: No vision change, wears glasses. No discharge or pain. Ears: No hearing loss, No tinnitus. Respiratory: No asthma, COPD, pneumonias. Positive shortness of breath. No hemoptysis. Cardiovascular: No chest pain, palpitation, positive leg edema. Gastrointestinal: No nausea, vomiting, diarrhea, constipation. No GI bleed. No hepatitis. Genitourinary: No dysuria, hematuria, kidney stone. No incontinance. Neurological: No headache, stroke, seizures.  Psychiatry: No psych facility admission for anxiety, depression, suicide. No detox. Skin: No rash. Musculoskeletal: No joint pain, fibromyalgia. No neck pain, back pain. Lymphadenopathy: No lymphadenopathy. Hematology: No anemia or easy bruising.   Blood pressure 135/78, pulse 99, temperature 98.2 F (36.8 C), temperature source Oral, resp. rate 18, height 6' 4"  (1.93 m), weight (!) 145.2 kg (320 lb 3.2 oz), SpO2 98 %. Body mass index is 38.98 kg/m. General appearance: alert,  cooperative, appears stated age and no distress Head: Normocephalic, atraumatic. Eyes: Blue eyes, pink conjunctiva, corneas clear. PERRL, EOM's intact. Neck: No adenopathy, no carotid bruit, no JVD, supple, symmetrical, trachea midline and thyroid not enlarged. 2'' mobile, non-tender swelling left lateral aspect of neck and 1" mobile swelling below the right ear lobe area. Resp: Clearing to auscultation bilaterally. Cardio: Regular rate and rhythm, S1, S2 normal, II/VI systolic murmur, no click, rub or gallop GI: Soft, non-tender; bowel sounds normal; no organomegaly. Extremities: 1 + edema, cyanosis or clubbing. Skin: Warm and dry.  Neurologic: Alert and oriented X 3, normal strength. Normal coordination and gait.  Assessment/Plan Acute left heart systolic failure. Hypertension H/O tobacco use  disorder. Acute kidney injury-improving Morbid obesity  R + L Heart catheterization in AM. Continue diuresis.  Birdie Riddle, MD  06/27/2017, 3:00 PM

## 2017-06-27 NOTE — Progress Notes (Signed)
Patient has watched educational video regarding cardiac catheterization. No questions or concerns at this time. Consent signed and place in pt chart.

## 2017-06-27 NOTE — Progress Notes (Signed)
PROGRESS NOTE   Alvin Levy  EVO:[id number]    DOB: 1971/04/10    DOA: 06/26/2017  PCP: Patient, No Pcp Per   I have briefly reviewed patients previous medical records in [facility].  Brief Narrative:  46 year old single male, works at [employer], [place] of "borderline hypertension" 5 years ago and since then has not seen many physicians, former smoker, presented to ED with 2 weeks history of progressive chest congestion, dyspnea on exertion which progressed to at rest, orthopnea, PND, lower extremity edema but no chest pain, dizziness or lightheadedness. Seen by tele medicine provider and treated for presumed respiratory illness with Z-Pak without improvement. In ED, markedly hypertensive. Admitted for acute systolic CHF. 2-D echo shows LVEF 30-35 percent and severe global hypokinesis. Cardiology/Dr. Doylene Canard consulted.   Assessment & Plan:   Principal Problem:   Acute CHF (congestive heart failure) (HCC) Active Problems:   Accelerated hypertension   AKI (acute kidney injury) ([place])   Hyperglycemia   Obesity, Class III, BMI 40-49.9 (morbid obesity) ([place])   Acute systolic CHF - Likely related to poorly controlled long-standing hypertension, hypertensive heart disease and nonischemic cardiomyopathy. - 2-D echo 06/27/17: LVEF 30-35% and severe global hypokinesis. - Started on Lasix 40 mg IV every 12 hours. -5.17 L since admission. Weight is down from 325 pounds >320 pounds. Continue currently Lasix. - Lisinopril that was started yesterday, held due to bump in creatinine from 1.26 > 1.3 to and at risk for worsening during aggressive diuresis. Should add ACEI or ARB going forward for his cardiomyopathy. - Cardiology consulted.  Cardiomyopathy - Likely nonischemic due to poorly controlled hypertension. - Consider ACEI or ARB when volume status is slightly better. - Cardiology consulted and will await their input regarding need for ischemic evaluation. - TSH 3.123.  Transient bradycardia -  Noted transient bradycardia at 37 bpm on 06/27/17 at 6:04 AM.? Mobitz type I second-degree AV block versus rule out higher degree AV block. Discussed and consulted with Cardiology, await input. - Asymptomatic.  Hypertensive urgency - Blood pressure in ED on arrival was as high as 169/116. - Amlodipine 10 MG daily and carvedilol 3.125 MG twice daily started. Blood pressure control much improved. Lisinopril stopped for now. - Patient was counseled extensively regarding the need for PCP follow-up and healthy lifestyle to address multiple medical issues. He verbalized understanding.  Stage II chronic kidney disease - No prior creatinine to compare. Presented with creatinine of 1.26 which has gone up to 1.32. Monitor closely while on aggressive IV diuretics.  Glucose intolerance/prediabetes - A1c 5.9. CBGs currently well controlled. DC SSI.  Morbid obesity/Body mass index is 38.98 kg/m. - Needs to diet, exercise and lose weight.  Former smoker - Quit 200 days ago. Prior to that smoked a pack per day for 20 years. Compression related him on his recovery.  Health maintenance Consulted case management for assistance with new PCP arrangement   DVT prophylaxis: Lovenox Code Status: Full Family Communication: None at bedside Disposition: DC home when medically improved, possibly in the next 2-3 days.   Consultants:  Cardiology/Dr. Doylene Canard   Procedures:  Echo  Antimicrobials:  None    Subjective: Feels much better. Dyspnea significantly improved. Leg swelling decreasing. No chest pain, palpitations, dizziness or lightheadedness.  ROS: States that he usually ambulates about 2 miles per day but lately noted dyspnea on exertion and had to stop 3-4 times to complete that distance and even developed dyspnea at rest, orthopnea and PND. Chest congestion and inability to bring  up phlegm. Progressive leg swelling.  Objective:  Vitals:   06/26/17 1200 06/26/17 1935 06/27/17 0641 06/27/17  1325  BP: (!) 117/56 120/82 131/82 135/78  Pulse: (!) 101 (!) 102 (!) 106 99  Resp: 18 18 18 18   Temp: 98 F (36.7 C) 97.8 F (36.6 C) 97.8 F (36.6 C) 98.2 F (36.8 C)  TempSrc: Oral Oral Oral Oral  SpO2: 96% 98% 98% 98%  Weight:   (!) 145.2 kg (320 lb 3.2 oz)   Height:        Examination:  General exam: Pleasant young male, moderately built and morbidly obese, sitting up comfortably in chair this morning Respiratory system: Occasional bibasal crackles but otherwise clear to auscultation. Respiratory effort normal. Cardiovascular system: S1 & S2 heard, RRR. No JVD, murmurs, rubs, gallops or clicks. 2+ pitting bilateral leg edema. Telemetry: Mostly sinus rhythm. Intermittent sinus tachycardia in the 110s-120s. Transient bradycardia up to 37 bpm on 06/27/17 at 6:04 AM? Mobitz type I second-degree AV block versus concern for higher degree AV block. Gastrointestinal system: Abdomen is nondistended, soft and nontender. No organomegaly or masses felt. Normal bowel sounds heard. Central nervous system: Alert and oriented. No focal neurological deficits. Extremities: Symmetric 5 x 5 power. Skin: No rashes, lesions or ulcers Psychiatry: Judgement and insight appear normal. Mood & affect appropriate.     Data Reviewed: I have personally reviewed following labs and imaging studies  CBC: Recent Labs  Lab 06/26/17 0304 06/26/17 0852  WBC 8.9 11.3*  NEUTROABS 4.4  --   HGB 14.1 14.8  HCT 41.4 42.8  MCV 87.5 87.9  PLT 290 941   Basic Metabolic Panel: Recent Labs  Lab 06/26/17 0304 06/26/17 0852 06/27/17 0310  NA 139  --  141  K 3.6  --  4.2  CL 104  --  102  CO2 25  --  29  GLUCOSE 119*  --  118*  BUN 24*  --  23*  CREATININE 1.26* 1.35* 1.32*  CALCIUM 9.2  --  9.2  MG  --   --  2.3   Liver Function Tests: Recent Labs  Lab 06/26/17 0304  AST 31  ALT 57  ALKPHOS 46  BILITOT 0.5  PROT 7.1  ALBUMIN 3.8    HbA1C: Recent Labs    06/27/17 0911  HGBA1C 5.9*    CBG: Recent Labs  Lab 06/26/17 1159 06/26/17 1635 06/26/17 2107 06/27/17 0747 06/27/17 1144  GLUCAP 110* 100* 105* 104* 85    No results found for this or any previous visit (from the past 240 hour(s)).       Radiology Studies: Dg Chest 2 View  Result Date: 06/26/2017 CLINICAL DATA:  Shortness of breath. EXAM: CHEST  2 VIEW COMPARISON:  None. FINDINGS: Mild cardiomegaly. Moderate pulmonary edema and bronchial thickening. Small right pleural effusion. No confluent airspace disease. No pneumothorax. No acute osseous abnormalities. IMPRESSION: Cardiomegaly with pulmonary edema and small right pleural effusion consistent with CHF. Electronically Signed   By: Jeb Levering M.D.   On: 06/26/2017 04:44   Dg Chest Port 1 View  Result Date: 06/27/2017 CLINICAL DATA:  Shortness of breath. EXAM: PORTABLE CHEST 1 VIEW COMPARISON:  Radiographs of June 26, 2017. FINDINGS: The heart size and mediastinal contours are within normal limits. No pneumothorax or pleural effusion is noted. Mild bilateral pulmonary edema is noted which is improved compared to prior exam. The visualized skeletal structures are unremarkable. IMPRESSION: Improved bilateral pulmonary edema. Electronically Signed   By: Sabino Dick  Brooke Bonito, M.D.   On: 06/27/2017 07:59        Scheduled Meds: . amLODipine  10 mg Oral Daily  . aspirin EC  81 mg Oral Daily  . carvedilol  3.125 mg Oral BID WC  . enoxaparin (LOVENOX) injection  40 mg Subcutaneous Q24H  . furosemide  40 mg Intravenous Q12H  . insulin aspart  0-20 Units Subcutaneous TID WC  . potassium chloride  10 mEq Oral BID  . sodium chloride flush  3 mL Intravenous Q12H   Continuous Infusions: . sodium chloride       LOS: 1 day     Yoshiaki Kreuser, MD, FACP, FHM. [facility] Pager [phone]  If 7PM-7AM, please contact night-coverage [email or web address] Password [email or web address] 06/27/2017, 1:56 PM

## 2017-06-27 NOTE — Progress Notes (Signed)
Pt had 2.17 seconds pulse, and his HR drops as low as in the 30's that does not sustain. Pt denies any palpitations or any discomfort. MD on call has been paged, and are waiting for his response. Currently, pt's HR is in the 80's. Will continue to monitor.

## 2017-06-27 NOTE — Progress Notes (Signed)
Nutrition Education Note  RD consulted for nutrition education regarding new onset CHF.  RD provided "Low Sodium Nutrition Therapy" handout from the Academy of [employer]. Reviewed patient's dietary recall. Provided examples on ways to decrease sodium intake in diet. Discouraged intake of processed foods and use of salt shaker. Encouraged fresh fruits and vegetables as well as whole grain sources of carbohydrates to maximize fiber intake.   Pt reports he is ready to change. Pt cooks on weekends however is busy during the week so has been leaning on prepared foods, such as pringle's, corn dogs and chicken nuggets. Discussed with pt about the possibility of preparing food for the week and reheating during the workday, pt seems agreeable. Pt and RD discussed reading the food labels and the number/servings to look for.   RD discussed why it is important for patient to adhere to diet recommendations, and emphasized the role of fluids, foods to avoid, and importance of weighing self daily. Teach back method used.  Expect good compliance.  Body mass index is 38.98 kg/m. Pt meets criteria for obese based on current BMI.  Current diet order is 2 g sodium diet, patient is consuming approximately 100% of meals at this time. Pt reports no recent weight changes and that it has been stable over the past year.   Labs and medications reviewed. No further nutrition interventions warranted at this time. RD contact information provided. If additional nutrition issues arise, please re-consult RD.   Parks Ranger, MS, RDN, LDN 06/27/2017 4:33 PM

## 2017-06-27 NOTE — Progress Notes (Signed)
  Echocardiogram 2D Echocardiogram has been performed.  Alvin Levy 06/27/2017, 12:45 PM

## 2017-06-27 NOTE — H&P (View-Only) (Signed)
Referring Physician:  [name] is an 46 y.o. male.                       Chief Complaint: Shortness of breath  HPI: 46 year old male presented with 2 week h/o of exertional shortness of breath. He denies chest pain. His  BNP is mildly elevated. His chest x-ray shows pulmonary edema. His LV function on echocardiogram done today shows wall motion abnormality with EF 35 %. He has h/o of untreated hypertension and has h/o prior smoking.  History reviewed. No pertinent past medical history.    History reviewed. No pertinent surgical history.  Family History  Problem Relation Age of Onset  . Heart disease Mother   . HIV/AIDS Father    Social History:  reports that he has quit smoking. He has quit using smokeless tobacco. He reports that he does not drink alcohol or use drugs.  Allergies: No Known Allergies  Medications Prior to Admission  Medication Sig Dispense Refill  . ibuprofen (ADVIL,MOTRIN) 200 MG tablet Take 200 mg every 6 (six) hours as needed by mouth for fever, headache or mild pain.      Results for orders placed or performed during the hospital encounter of 06/26/17 (from the past 48 hour(s))  CBC with Differential/Platelet     Status: None   Collection Time: 06/26/17  3:04 AM  Result Value Ref Range   WBC 8.9 4.0 - 10.5 K/uL   RBC 4.73 4.22 - 5.81 MIL/uL   Hemoglobin 14.1 13.0 - 17.0 g/dL   HCT 41.4 39.0 - 52.0 %   MCV 87.5 78.0 - 100.0 fL   MCH 29.8 26.0 - 34.0 pg   MCHC 34.1 30.0 - 36.0 g/dL   RDW 14.1 11.5 - 15.5 %   Platelets 290 150 - 400 K/uL   Neutrophils Relative % 48 %   Neutro Abs 4.4 1.7 - 7.7 K/uL   Lymphocytes Relative 38 %   Lymphs Abs 3.4 0.7 - 4.0 K/uL   Monocytes Relative 9 %   Monocytes Absolute 0.8 0.1 - 1.0 K/uL   Eosinophils Relative 4 %   Eosinophils Absolute 0.3 0.0 - 0.7 K/uL   Basophils Relative 1 %   Basophils Absolute 0.1 0.0 - 0.1 K/uL  Comprehensive metabolic panel     Status: Abnormal   Collection Time: 06/26/17  3:04 AM   Result Value Ref Range   Sodium 139 135 - 145 mmol/L   Potassium 3.6 3.5 - 5.1 mmol/L   Chloride 104 101 - 111 mmol/L   CO2 25 22 - 32 mmol/L   Glucose, Bld 119 (H) 65 - 99 mg/dL   BUN 24 (H) 6 - 20 mg/dL   Creatinine, Ser 1.26 (H) 0.61 - 1.24 mg/dL   Calcium 9.2 8.9 - 10.3 mg/dL   Total Protein 7.1 6.5 - 8.1 g/dL   Albumin 3.8 3.5 - 5.0 g/dL   AST 31 15 - 41 U/L   ALT 57 17 - 63 U/L   Alkaline Phosphatase 46 38 - 126 U/L   Total Bilirubin 0.5 0.3 - 1.2 mg/dL   GFR calc non Af Amer >60 >60 mL/min   GFR calc Af Amer >60 >60 mL/min    Comment: (NOTE) The eGFR has been calculated using the CKD EPI equation. This calculation has not been validated in all clinical situations. eGFR's persistently <60 mL/min signify possible Chronic Kidney Disease.    Anion gap 10 5 - 15  Brain  natriuretic peptide     Status: Abnormal   Collection Time: 06/26/17  3:40 AM  Result Value Ref Range   B Natriuretic Peptide 255.4 (H) 0.0 - 100.0 pg/mL  HIV antibody (Routine Testing)     Status: None   Collection Time: 06/26/17  8:52 AM  Result Value Ref Range   HIV Screen 4th Generation wRfx Non Reactive Non Reactive    Comment: (NOTE) Performed At: [facility] [place], [place] [id number] Rush Farmer MD [id number]   CBC     Status: Abnormal   Collection Time: 06/26/17  8:52 AM  Result Value Ref Range   WBC 11.3 (H) 4.0 - 10.5 K/uL   RBC 4.87 4.22 - 5.81 MIL/uL   Hemoglobin 14.8 13.0 - 17.0 g/dL   HCT 42.8 39.0 - 52.0 %   MCV 87.9 78.0 - 100.0 fL   MCH 30.4 26.0 - 34.0 pg   MCHC 34.6 30.0 - 36.0 g/dL   RDW 14.1 11.5 - 15.5 %   Platelets 284 150 - 400 K/uL  Creatinine, serum     Status: Abnormal   Collection Time: 06/26/17  8:52 AM  Result Value Ref Range   Creatinine, Ser 1.35 (H) 0.61 - 1.24 mg/dL   GFR calc non Af Amer >60 >60 mL/min   GFR calc Af Amer >60 >60 mL/min    Comment: (NOTE) The eGFR has been calculated using the CKD EPI equation. This  calculation has not been validated in all clinical situations. eGFR's persistently <60 mL/min signify possible Chronic Kidney Disease.   Glucose, capillary     Status: Abnormal   Collection Time: 06/26/17 11:59 AM  Result Value Ref Range   Glucose-Capillary 110 (H) 65 - 99 mg/dL   Comment 1 Notify RN    Comment 2 Document in Chart   Urinalysis, Routine w reflex microscopic     Status: Abnormal   Collection Time: 06/26/17  1:27 PM  Result Value Ref Range   Color, Urine STRAW (A) YELLOW   APPearance CLEAR CLEAR   Specific Gravity, Urine 1.011 1.005 - 1.030   pH 7.0 5.0 - 8.0   Glucose, UA NEGATIVE NEGATIVE mg/dL   Hgb urine dipstick NEGATIVE NEGATIVE   Bilirubin Urine NEGATIVE NEGATIVE   Ketones, ur NEGATIVE NEGATIVE mg/dL   Protein, ur NEGATIVE NEGATIVE mg/dL   Nitrite NEGATIVE NEGATIVE   Leukocytes, UA NEGATIVE NEGATIVE  Glucose, capillary     Status: Abnormal   Collection Time: 06/26/17  4:35 PM  Result Value Ref Range   Glucose-Capillary 100 (H) 65 - 99 mg/dL   Comment 1 Notify RN    Comment 2 Document in Chart   Glucose, capillary     Status: Abnormal   Collection Time: 06/26/17  9:07 PM  Result Value Ref Range   Glucose-Capillary 105 (H) 65 - 99 mg/dL  Basic metabolic panel     Status: Abnormal   Collection Time: 06/27/17  3:10 AM  Result Value Ref Range   Sodium 141 135 - 145 mmol/L   Potassium 4.2 3.5 - 5.1 mmol/L   Chloride 102 101 - 111 mmol/L   CO2 29 22 - 32 mmol/L   Glucose, Bld 118 (H) 65 - 99 mg/dL   BUN 23 (H) 6 - 20 mg/dL   Creatinine, Ser 1.32 (H) 0.61 - 1.24 mg/dL   Calcium 9.2 8.9 - 10.3 mg/dL   GFR calc non Af Amer >60 >60 mL/min   GFR calc Af Amer >60 >60 mL/min  Comment: (NOTE) The eGFR has been calculated using the CKD EPI equation. This calculation has not been validated in all clinical situations. eGFR's persistently <60 mL/min signify possible Chronic Kidney Disease.    Anion gap 10 5 - 15  Magnesium     Status: None   Collection Time:  06/27/17  3:10 AM  Result Value Ref Range   Magnesium 2.3 1.7 - 2.4 mg/dL  Glucose, capillary     Status: Abnormal   Collection Time: 06/27/17  7:47 AM  Result Value Ref Range   Glucose-Capillary 104 (H) 65 - 99 mg/dL   Comment 1 Notify RN    Comment 2 Document in Chart   Hemoglobin A1c     Status: Abnormal   Collection Time: 06/27/17  9:11 AM  Result Value Ref Range   Hgb A1c MFr Bld 5.9 (H) 4.8 - 5.6 %    Comment: (NOTE) Pre diabetes:          5.7%-6.4% Diabetes:              >6.4% Glycemic control for   <7.0% adults with diabetes    Mean Plasma Glucose 122.63 mg/dL  TSH     Status: None   Collection Time: 06/27/17  9:11 AM  Result Value Ref Range   TSH 3.123 0.350 - 4.500 uIU/mL    Comment: Performed by a 3rd Generation assay with a functional sensitivity of <=0.01 uIU/mL.  Glucose, capillary     Status: None   Collection Time: 06/27/17 11:44 AM  Result Value Ref Range   Glucose-Capillary 85 65 - 99 mg/dL   Comment 1 Notify RN    Comment 2 Document in Chart    Dg Chest 2 View  Result Date: 06/26/2017 CLINICAL DATA:  Shortness of breath. EXAM: CHEST  2 VIEW COMPARISON:  None. FINDINGS: Mild cardiomegaly. Moderate pulmonary edema and bronchial thickening. Small right pleural effusion. No confluent airspace disease. No pneumothorax. No acute osseous abnormalities. IMPRESSION: Cardiomegaly with pulmonary edema and small right pleural effusion consistent with CHF. Electronically Signed   By: Jeb Levering M.D.   On: 06/26/2017 04:44   Dg Chest Port 1 View  Result Date: 06/27/2017 CLINICAL DATA:  Shortness of breath. EXAM: PORTABLE CHEST 1 VIEW COMPARISON:  Radiographs of June 26, 2017. FINDINGS: The heart size and mediastinal contours are within normal limits. No pneumothorax or pleural effusion is noted. Mild bilateral pulmonary edema is noted which is improved compared to prior exam. The visualized skeletal structures are unremarkable. IMPRESSION: Improved bilateral  pulmonary edema. Electronically Signed   By: Marijo Conception, M.D.   On: 06/27/2017 07:59    Review Of Systems Constitutional: No fever, chills, weight loss or gain. Eyes: No vision change, wears glasses. No discharge or pain. Ears: No hearing loss, No tinnitus. Respiratory: No asthma, COPD, pneumonias. Positive shortness of breath. No hemoptysis. Cardiovascular: No chest pain, palpitation, positive leg edema. Gastrointestinal: No nausea, vomiting, diarrhea, constipation. No GI bleed. No hepatitis. Genitourinary: No dysuria, hematuria, kidney stone. No incontinance. Neurological: No headache, stroke, seizures.  Psychiatry: No psych facility admission for anxiety, depression, suicide. No detox. Skin: No rash. Musculoskeletal: No joint pain, fibromyalgia. No neck pain, back pain. Lymphadenopathy: No lymphadenopathy. Hematology: No anemia or easy bruising.   Blood pressure 135/78, pulse 99, temperature 98.2 F (36.8 C), temperature source Oral, resp. rate 18, height 6' 4"  (1.93 m), weight (!) 145.2 kg (320 lb 3.2 oz), SpO2 98 %. Body mass index is 38.98 kg/m. General appearance: alert,  cooperative, appears stated age and no distress Head: Normocephalic, atraumatic. Eyes: Blue eyes, pink conjunctiva, corneas clear. PERRL, EOM's intact. Neck: No adenopathy, no carotid bruit, no JVD, supple, symmetrical, trachea midline and thyroid not enlarged. 2'' mobile, non-tender swelling left lateral aspect of neck and 1" mobile swelling below the right ear lobe area. Resp: Clearing to auscultation bilaterally. Cardio: Regular rate and rhythm, S1, S2 normal, II/VI systolic murmur, no click, rub or gallop GI: Soft, non-tender; bowel sounds normal; no organomegaly. Extremities: 1 + edema, cyanosis or clubbing. Skin: Warm and dry.  Neurologic: Alert and oriented X 3, normal strength. Normal coordination and gait.  Assessment/Plan Acute left heart systolic failure. Hypertension H/O tobacco use  disorder. Acute kidney injury-improving Morbid obesity  R + L Heart catheterization in AM. Continue diuresis.  Birdie Riddle, MD  06/27/2017, 3:00 PM

## 2017-06-27 NOTE — Care Management Note (Signed)
Case Management Note  Patient Details  Name: Alvin Levy MRN: [id number] Date of Birth: 08/08/1971  Subjective/Objective:    CHF               Action/Plan: Patient is independent of all of his ADL's; works full time at [employer]; has private insurance with [employer] with prescription drug coverage; [facility]; No PCP, patient is agreeable to have CM assist him in finding a PCP; apt made with Dr Grier Mitts with [facility] for 06/03/2017 at 9:30 am.  Expected Discharge Date:  06/29/17               Expected Discharge Plan:  Home/Self Care  Discharge planning Services  Follow-up appt scheduled, CM Consult  Status of Service:  In process, will continue to follow  Sherrilyn Rist [phone] 06/27/2017, 9:58 AM

## 2017-06-28 ENCOUNTER — Encounter (HOSPITAL_COMMUNITY)
Admission: EM | Disposition: A | Discharge status: Home / Self Care | Payer: Self-pay | Source: Home / Self Care | Attending: Cardiovascular Disease

## 2017-06-28 HISTORY — PX: RIGHT/LEFT HEART CATH AND CORONARY ANGIOGRAPHY: CATH118266

## 2017-06-28 LAB — POCT I-STAT 3, VENOUS BLOOD GAS (G3P V)
Acid-Base Excess: 4 mmol/L — ABNORMAL HIGH (ref 0.0–2.0)
Bicarbonate: 29 mmol/L — ABNORMAL HIGH (ref 20.0–28.0)
O2 SAT: 68 %
PH VEN: 7.425 (ref 7.250–7.430)
TCO2: 30 mmol/L (ref 22–32)
pCO2, Ven: 44.1 mmHg (ref 44.0–60.0)
pO2, Ven: 35 mmHg (ref 32.0–45.0)

## 2017-06-28 LAB — POCT I-STAT 3, ART BLOOD GAS (G3+)
Acid-Base Excess: 3 mmol/L — ABNORMAL HIGH (ref 0.0–2.0)
Bicarbonate: 27 mmol/L (ref 20.0–28.0)
O2 Saturation: 97 %
PCO2 ART: 38.9 mmHg (ref 32.0–48.0)
PH ART: 7.45 (ref 7.350–7.450)
TCO2: 28 mmol/L (ref 22–32)
pO2, Arterial: 89 mmHg (ref 83.0–108.0)

## 2017-06-28 LAB — POCT I-STAT TROPONIN I: Troponin i, poc: 0.04 ng/mL (ref 0.00–0.08)

## 2017-06-28 LAB — CBC
HCT: 40.6 % (ref 39.0–52.0)
Hemoglobin: 13.6 g/dL (ref 13.0–17.0)
MCH: 29.5 pg (ref 26.0–34.0)
MCHC: 33.5 g/dL (ref 30.0–36.0)
MCV: 88.1 fL (ref 78.0–100.0)
PLATELETS: 250 10*3/uL (ref 150–400)
RBC: 4.61 MIL/uL (ref 4.22–5.81)
RDW: 14.5 % (ref 11.5–15.5)
WBC: 9.4 10*3/uL (ref 4.0–10.5)

## 2017-06-28 LAB — PROTIME-INR
INR: 1.05
Prothrombin Time: 13.6 seconds (ref 11.4–15.2)

## 2017-06-28 LAB — BASIC METABOLIC PANEL
Anion gap: 8 (ref 5–15)
BUN: 22 mg/dL — AB (ref 6–20)
CHLORIDE: 104 mmol/L (ref 101–111)
CO2: 27 mmol/L (ref 22–32)
CREATININE: 1.17 mg/dL (ref 0.61–1.24)
Calcium: 8.8 mg/dL — ABNORMAL LOW (ref 8.9–10.3)
GFR calc Af Amer: >60 mL/min (ref >60)
GFR calc non Af Amer: >60 mL/min (ref >60)
Glucose, Bld: 108 mg/dL — ABNORMAL HIGH (ref 65–99)
Potassium: 4 mmol/L (ref 3.5–5.1)
SODIUM: 139 mmol/L (ref 135–145)

## 2017-06-28 LAB — CG4 I-STAT (LACTIC ACID): Lactic Acid, Venous: 1.86 mmol/L (ref 0.5–1.9)

## 2017-06-28 LAB — POCT ACTIVATED CLOTTING TIME
ACTIVATED CLOTTING TIME: 125 s
ACTIVATED CLOTTING TIME: 136 s

## 2017-06-28 SURGERY — RIGHT/LEFT HEART CATH AND CORONARY ANGIOGRAPHY
Anesthesia: LOCAL

## 2017-06-28 MED ORDER — MIDAZOLAM HCL 2 MG/2ML IJ SOLN
INTRAMUSCULAR | Status: DC | PRN
  Administered 2017-06-28: 1 mg via INTRAVENOUS

## 2017-06-28 MED ORDER — IOPAMIDOL (ISOVUE-370) INJECTION 76%
INTRAVENOUS | Status: AC
  Filled 2017-06-28: qty 100

## 2017-06-28 MED ORDER — SODIUM CHLORIDE 0.9% FLUSH
3.0000 mL | Freq: Two times a day (BID) | INTRAVENOUS | Status: DC
  Administered 2017-06-29: 3 mL via INTRAVENOUS

## 2017-06-28 MED ORDER — MIDAZOLAM HCL 2 MG/2ML IJ SOLN
INTRAMUSCULAR | Status: AC
  Filled 2017-06-28: qty 2

## 2017-06-28 MED ORDER — HEPARIN (PORCINE) IN NACL 2-0.9 UNIT/ML-% IJ SOLN
INTRAMUSCULAR | Status: AC | PRN
  Administered 2017-06-28: 1000 mL

## 2017-06-28 MED ORDER — HEPARIN SODIUM (PORCINE) 1000 UNIT/ML IJ SOLN
INTRAMUSCULAR | Status: DC | PRN
  Administered 2017-06-28: 2000 [IU] via INTRAVENOUS

## 2017-06-28 MED ORDER — HEPARIN (PORCINE) IN NACL 2-0.9 UNIT/ML-% IJ SOLN
INTRAMUSCULAR | Status: AC
  Filled 2017-06-28: qty 1000

## 2017-06-28 MED ORDER — LIDOCAINE HCL (PF) 1 % IJ SOLN
INTRAMUSCULAR | Status: AC
  Filled 2017-06-28: qty 30

## 2017-06-28 MED ORDER — SODIUM CHLORIDE 0.9% FLUSH: 3.0000 mL | INTRAVENOUS | Status: DC | PRN

## 2017-06-28 MED ORDER — FENTANYL CITRATE (PF) 100 MCG/2ML IJ SOLN
INTRAMUSCULAR | Status: AC
  Filled 2017-06-28: qty 2

## 2017-06-28 MED ORDER — LIDOCAINE HCL (PF) 1 % IJ SOLN
INTRAMUSCULAR | Status: DC | PRN
  Administered 2017-06-28: 30 mL

## 2017-06-28 MED ORDER — IOPAMIDOL (ISOVUE-370) INJECTION 76%
INTRAVENOUS | Status: DC | PRN
  Administered 2017-06-28: 50 mL

## 2017-06-28 MED ORDER — FENTANYL CITRATE (PF) 100 MCG/2ML IJ SOLN
INTRAMUSCULAR | Status: DC | PRN
  Administered 2017-06-28: 25 ug via INTRAVENOUS

## 2017-06-28 MED ORDER — SODIUM CHLORIDE 0.9 % IV SOLN: 250.0000 mL | INTRAVENOUS | Status: DC | PRN

## 2017-06-28 MED ORDER — HEPARIN SODIUM (PORCINE) 1000 UNIT/ML IJ SOLN
INTRAMUSCULAR | Status: AC
  Filled 2017-06-28: qty 1

## 2017-06-28 SURGICAL SUPPLY — 15 items
CATH INFINITI 5 FR STR PIGTAIL (CATHETERS) ×2 IMPLANT
CATH INFINITI 5FR JL4 (CATHETERS) ×2 IMPLANT
CATH INFINITI JR4 5F (CATHETERS) ×2 IMPLANT
CATH SWAN GANZ 7F STRAIGHT (CATHETERS) ×2 IMPLANT
GUIDEWIRE .025 260CM (WIRE) ×2 IMPLANT
HOVERMATT SINGLE USE (MISCELLANEOUS) ×2 IMPLANT
KIT HEART LEFT (KITS) ×2 IMPLANT
NEEDLE SMART REG 18GX2-3/4 (NEEDLE) ×2 IMPLANT
PACK CARDIAC CATHETERIZATION (CUSTOM PROCEDURE TRAY) ×2 IMPLANT
SHEATH PINNACLE 5F 10CM (SHEATH) ×2 IMPLANT
SHEATH PINNACLE 7F 10CM (SHEATH) ×2 IMPLANT
TRANSDUCER W/STOPCOCK (MISCELLANEOUS) ×4 IMPLANT
TUBING ART PRESS 72  MALE/FEM (TUBING) ×1
TUBING ART PRESS 72 MALE/FEM (TUBING) ×1 IMPLANT
WIRE EMERALD 3MM-J .035X150CM (WIRE) ×2 IMPLANT

## 2017-06-28 NOTE — Plan of Care (Signed)
PT ambulating independently in room.

## 2017-06-28 NOTE — Progress Notes (Signed)
Site area: Right groin a 5 french arterial and 7 french venous sheath was removed  Site Prior to Removal:  Level 0  Pressure Applied For 20 MINUTES     Bedrest Beginning at 1450p  Manual:   Yes.    Patient Status During Pull:  stable  Post Pull Groin Site:  Level 0  Post Pull Instructions Given:  Yes.    Post Pull Pulses Present:  Yes.    Dressing Applied:  Yes.    Comments:  VS remain stable during sheath pull.

## 2017-06-28 NOTE — Interval H&P Note (Signed)
History and Physical Interval Note:  06/28/2017 12:44 PM  Alvin Levy  has presented today for surgery, with the diagnosis of cp  The various methods of treatment have been discussed with the patient and family. After consideration of risks, benefits and other options for treatment, the patient has consented to  Procedure(s): RIGHT/LEFT HEART CATH AND CORONARY ANGIOGRAPHY (N/A) as a surgical intervention .  The patient's history has been reviewed, patient examined, no change in status, stable for surgery.  I have reviewed the patient's chart and labs.  Questions were answered to the patient's satisfaction.     Chaney Ingram S

## 2017-06-28 NOTE — Progress Notes (Signed)
Bedrest to be up at 1850. Site remains level 0.

## 2017-06-29 ENCOUNTER — Encounter (HOSPITAL_COMMUNITY): Payer: Self-pay | Admitting: Cardiovascular Disease

## 2017-06-29 LAB — BASIC METABOLIC PANEL
Anion gap: 9 (ref 5–15)
BUN: 24 mg/dL — AB (ref 6–20)
CHLORIDE: 102 mmol/L (ref 101–111)
CO2: 27 mmol/L (ref 22–32)
Calcium: 8.8 mg/dL — ABNORMAL LOW (ref 8.9–10.3)
Creatinine, Ser: 1.3 mg/dL — ABNORMAL HIGH (ref 0.61–1.24)
GFR calc non Af Amer: >60 mL/min (ref >60)
Glucose, Bld: 108 mg/dL — ABNORMAL HIGH (ref 65–99)
POTASSIUM: 3.5 mmol/L (ref 3.5–5.1)
SODIUM: 138 mmol/L (ref 135–145)

## 2017-06-29 LAB — CBC
HEMATOCRIT: 40.6 % (ref 39.0–52.0)
Hemoglobin: 13.6 g/dL (ref 13.0–17.0)
MCH: 29.5 pg (ref 26.0–34.0)
MCHC: 33.5 g/dL (ref 30.0–36.0)
MCV: 88.1 fL (ref 78.0–100.0)
Platelets: 257 10*3/uL (ref 150–400)
RBC: 4.61 MIL/uL (ref 4.22–5.81)
RDW: 14.3 % (ref 11.5–15.5)
WBC: 9.6 10*3/uL (ref 4.0–10.5)

## 2017-06-29 LAB — LIPID PANEL
CHOL/HDL RATIO: 5.5 ratio
Cholesterol: 172 mg/dL (ref 0–200)
HDL: 31 mg/dL — AB (ref >40)
LDL CALC: 116 mg/dL — AB (ref 0–99)
Triglycerides: 126 mg/dL (ref <150)
VLDL: 25 mg/dL (ref 0–40)

## 2017-06-29 MED ORDER — FUROSEMIDE 40 MG PO TABS: 40.0000 mg | ORAL_TABLET | Freq: Every day | ORAL | 3 refills | Status: AC

## 2017-06-29 MED ORDER — AMLODIPINE BESYLATE 10 MG PO TABS
10.0000 mg | ORAL_TABLET | Freq: Every day | ORAL | 1 refills | Status: AC
Start: 2017-06-30 — End: ?

## 2017-06-29 MED ORDER — CARVEDILOL 3.125 MG PO TABS: 3.1250 mg | ORAL_TABLET | Freq: Two times a day (BID) | ORAL | 1 refills | Status: AC

## 2017-06-29 MED ORDER — POTASSIUM CHLORIDE CRYS ER 10 MEQ PO TBCR: 10.0000 meq | EXTENDED_RELEASE_TABLET | Freq: Two times a day (BID) | ORAL | 3 refills | Status: AC

## 2017-06-29 MED ORDER — LOSARTAN POTASSIUM 25 MG PO TABS: 25.0000 mg | ORAL_TABLET | Freq: Every day | ORAL | 3 refills | Status: AC

## 2017-06-29 MED ORDER — ASPIRIN 81 MG PO TBEC: 81.0000 mg | DELAYED_RELEASE_TABLET | Freq: Every day | ORAL | 1 refills | Status: AC

## 2017-06-29 NOTE — Progress Notes (Signed)
Pt discharged home. IV and tele removed, Discharge instructions given. Taken out via wheelchair. Questions answered.

## 2017-06-29 NOTE — Discharge Summary (Signed)
Physician Discharge Summary  Patient ID: Alvin Levy MRN: [id number] DOB/AGE: 46/03/1971 46 y.o.  Admit date: 06/26/2017 Discharge date: 06/29/2017  Admission Diagnoses: Acute left heart systolic failure Acute on chronic injury, II Hypertension Morbid obesity History of tobacco use disorder  Discharge Diagnoses:  Principal Problem:   Acute systolic left heart failure (HCC) Active Problems:   Accelerated hypertension   AKI (acute kidney injury) ([place])   Hyperglycemia   Obesity, Class III, BMI 40-49.9 (morbid obesity) ([place])   History of tobacco use disorder  Discharged Condition: fair  Hospital Course: 46 year old male presented with 2-week history of exertional dyspnea he denied chest pain his BNP was elevated chest x-ray showed pulmonary edema LV function was decreased by 50% and dilated.  He underwent right and left heart catheterization showing normal right heart pressures, moderately elevated left ventricular end-diastolic pressure and normal coronaries. He was given instructions on medications diet and activity for heart failure.  He will follow-up with me in 1 week and by primary care physician in 1 week.  Consults: cardiology  Significant Diagnostic Studies: labs: CBC was essentially unremarkable, we made it was near normal BUN and creatinine of 24 and 1.26.TSH was normal.  Hemoglobin A1c was 5.9  EKG showed sinus tachycardia.  Chest x-ray showed cardiomegaly with pulmonary edema and small right pleural effusion.  Cardiac catheterization showed normal coronaries and normal right heart pressures mildly elevated left ventricular end diastolic pressure.  Treatments: cardiac meds: carvedilol, amlodipine, furosemide, potassium and losartan.  Discharge Exam: Blood pressure 112/72, pulse 95, temperature (!) 97.5 F (36.4 C), temperature source Oral, resp. rate 20, height 6\' 4"  (1.93 m), weight (!) 141.3 kg (311 lb 6.4 oz), SpO2 100 %. General appearance: alert, cooperative and  appears stated age. Head: Normocephalic, atraumatic. Eyes: Blue eyes, pink conjunctiva, corneas clear. PERRL, EOM's intact.  Neck: No adenopathy, no carotid bruit, no JVD, supple, symmetrical, trachea midline and thyroid not enlarged. Resp: Clear to auscultation bilaterally. Cardio: Regular rate and rhythm, S1, S2 normal, II/VI systolic murmur, no click, rub or gallop. GI: Soft, non-tender; bowel sounds normal; no organomegaly. Extremities: No edema, cyanosis or clubbing. No right groin hematoma. Skin: Warm and dry.  Neurologic: Alert and oriented X 3, normal strength and tone. Normal coordination and gait.  Disposition: 01, Home or self care.   Allergies as of 06/29/2017   No Known Allergies     Medication List    STOP taking these medications   ibuprofen 200 MG tablet Commonly known as:  ADVIL,MOTRIN     TAKE these medications   amLODipine 10 MG tablet Commonly known as:  NORVASC Take 1 tablet (10 mg total) daily by mouth. Start taking on:  06/30/2017   aspirin 81 MG EC tablet Take 1 tablet (81 mg total) daily by mouth. Start taking on:  06/30/2017   carvedilol 3.125 MG tablet Commonly known as:  COREG Take 1 tablet (3.125 mg total) 2 (two) times daily with a meal by mouth.   furosemide 40 MG tablet Commonly known as:  LASIX Take 1 tablet (40 mg total) daily by mouth.   losartan 25 MG tablet Commonly known as:  COZAAR Take 1 tablet (25 mg total) daily by mouth.   potassium chloride 10 MEQ tablet Commonly known as:  K-DUR,KLOR-CON Take 1 tablet (10 mEq total) 2 (two) times daily by mouth.      Follow-up Information    Billie Ruddy, MD Follow up on 07/04/2017.   Specialty:  Family Medicine Why:  at  9:30 am; please try to keep your apt or call to reschedule Contact information: [place] [address] [phone]        Dixie Dials, MD. Schedule an appointment as soon as possible for a visit in 1 week(s).   Specialty:   Cardiology Contact information: [place] [address] [phone]           Signed: Birdie Riddle 06/29/2017, 12:45 PM

## 2017-07-04 ENCOUNTER — Ambulatory Visit (INDEPENDENT_AMBULATORY_CARE_PROVIDER_SITE_OTHER): Payer: 59 | Admitting: Family Medicine

## 2017-07-04 ENCOUNTER — Encounter: Payer: Self-pay | Admitting: Family Medicine

## 2017-07-04 VITALS — BP 110/80 | HR 95 | Temp 98.0°F | Ht 74.5 in | Wt 305.7 lb

## 2017-07-04 DIAGNOSIS — H269 Unspecified cataract: Secondary | ICD-10-CM | POA: Diagnosis not present

## 2017-07-04 DIAGNOSIS — I10 Essential (primary) hypertension: Secondary | ICD-10-CM

## 2017-07-04 DIAGNOSIS — R7303 Prediabetes: Secondary | ICD-10-CM | POA: Diagnosis not present

## 2017-07-04 DIAGNOSIS — Z7689 Persons encountering health services in other specified circumstances: Secondary | ICD-10-CM

## 2017-07-04 DIAGNOSIS — Z23 Encounter for immunization: Secondary | ICD-10-CM

## 2017-07-04 DIAGNOSIS — E669 Obesity, unspecified: Secondary | ICD-10-CM | POA: Diagnosis not present

## 2017-07-04 DIAGNOSIS — I5021 Acute systolic (congestive) heart failure: Secondary | ICD-10-CM

## 2017-07-04 DIAGNOSIS — D171 Benign lipomatous neoplasm of skin and subcutaneous tissue of trunk: Secondary | ICD-10-CM | POA: Diagnosis not present

## 2017-07-04 DIAGNOSIS — D170 Benign lipomatous neoplasm of skin and subcutaneous tissue of head, face and neck: Secondary | ICD-10-CM | POA: Diagnosis not present

## 2017-07-04 LAB — BASIC METABOLIC PANEL
BUN: 27 mg/dL — AB (ref 6–23)
CO2: 30 mEq/L (ref 19–32)
CREATININE: 1.4 mg/dL (ref 0.40–1.50)
Calcium: 10.3 mg/dL (ref 8.4–10.5)
Chloride: 99 mEq/L (ref 96–112)
GFR: 57.96 mL/min — AB (ref >60.00)
Glucose, Bld: 99 mg/dL (ref 70–99)
Potassium: 5.2 mEq/L — ABNORMAL HIGH (ref 3.5–5.1)
SODIUM: 139 meq/L (ref 135–145)

## 2017-07-04 NOTE — Patient Instructions (Addendum)
Lipoma A lipoma is a noncancerous (benign) tumor that is made up of fat cells. This is a very common type of soft-tissue growth. Lipomas are usually found under the skin (subcutaneous). They may occur in any tissue of the body that contains fat. Common areas for lipomas to appear include the back, shoulders, buttocks, and thighs. Lipomas grow slowly, and they are usually painless. Most lipomas do not cause problems and do not require treatment. What are the causes? The cause of this condition is not known. What increases the risk? This condition is more likely to develop in:  People who are 5-10 years old.  People who have a family history of lipomas.  What are the signs or symptoms? A lipoma usually appears as a small, round bump under the skin. It may feel soft or rubbery, but the firmness can vary. Most lipomas are not painful. However, a lipoma may become painful if it is located in an area where it pushes on nerves. How is this diagnosed? A lipoma can usually be diagnosed with a physical exam. You may also have tests to confirm the diagnosis and to rule out other conditions. Tests may include:  Imaging tests, such as a CT scan or MRI.  Removal of a tissue sample to be looked at under a microscope (biopsy).  How is this treated? Treatment is not needed for small lipomas that are not causing problems. If a lipoma continues to get bigger or it causes problems, removal is often the best option. Lipomas can also be removed to improve appearance. Removal of a lipoma is usually done with a surgery in which the fatty cells and the surrounding capsule are removed. Most often, a medicine that numbs the area (local anesthetic) is used for this procedure. Follow these instructions at home:  Keep all follow-up visits as directed by your health care provider. This is important. Contact a health care provider if:  Your lipoma becomes larger or hard.  Your lipoma becomes painful, red, or  increasingly swollen. These could be signs of infection or a more serious condition. This information is not intended to replace advice given to you by your health care provider. Make sure you discuss any questions you have with your health care provider. Document Released: 07/30/2002 Document Revised: 01/15/2016 Document Reviewed: 08/05/2014 Elsevier Interactive Patient Education  2018 [employer].  Cataract A cataract is cloudiness on the lens of your eye. The lens is the clear part of your eye that is behind your iris and pupil. The lens focuses light on the retina, which lets you see clearly. When a lens becomes cloudy, vision may become blurry. The clouding can range from a tiny dot to complete cloudiness. As some cataracts develop, they make a person more nearsighted. Other cataracts increase glare. Cataracts can worsen over time, and sometimes the pupil can look white. Cataracts get bigger and they cloud more of the lens, making it difficult to see. Cataracts can affect one eye or both eyes. What are the causes? Most cataracts are associated with age-related eye changes. The eye lens is mostly made up of water and protein. Normally, this protein is arranged in a way that keeps the lens clear. Cataracts develop when protein begins to clump together over time. This clouds the lens and lets less light pass through to the retina, which causes blurry vision. What increases the risk? This condition is more likely to develop in people who:  Are 16 years of age or older.  Have diabetes.  Have high blood pressure.  Takecertain medicines, such as steroids or hormone replacement therapy.  Have had an eye injury.  Have or have had eye inflammation.  Have a family history of cataracts.  Smoke.  Drink alcohol heavily.  Are frequently exposed to sun or very strong light without eye protection.  Are obese.  Have been exposed to large amounts of radiation, lead, or other toxic  substances.  Have had eye surgery.  What are the signs or symptoms? The main symptom of a cataract is blurry vision. Your vision may change or get worse over time. Other symptoms include:  Increased glare.  Seeing a bright ring or halo around light.  Poor night vision.  Double vision in one eye.  Having trouble seeing, even while wearing contact lenses or glasses.  Seeing colors that appear faded.  Trouble telling the difference between blue and purple.  Needing frequent changes to your prescription glasses or contacts.  How is this diagnosed? This condition is diagnosed with a medical history and eye exam. You may need to see an eye specialist (optometrist or ophthalmologist). Your health care provider may enlarge (dilate) your pupils with eye drops to see the back of your eye more clearly and look for signs of cataracts or other damage. You may also have tests, including:  A visual acuity test. This uses a chart to determine the smallest letters that you can see from a specific distance.  A slit-lamp exam. This uses a microscope to examine small sections of your eye for abnormalities.  Tonometry. This test measures the pressure of the fluid inside your eye.  How is this treated? Treatment depends on the stage of your cataract. For an early cataract, vision may improve by using different eyeglasses or stronger lighting. If that does not help your vision, surgery may be recommended to remove the cataract. If your health care provider thinks your cataract may be linked to any medicines that you are taking, he or she may change your medicines. Follow these instructions at home: Lifestyle  Use stronger or brighter lighting.  Consider using a magnifying glass for reading or other activities.  Become familiar with your surroundings. Having poor vision can put you at a greater risk for tripping, falling, or bumping into things.  Wear sunglasses and a hat if you are sensitive to  bright light or are having problems with glare.  Quit smoking if you smoke. If you need help quitting, talk with your health care provider. General instructions  If you are prescribed new eyeglasses, wear them as told by your health care provider.  Take over-the-counter and prescription medicines only as told by your health care provider. Do not change your medicines unless told by your health care provider.  Do not drive or operate heavy machinery if your vision is blurry, particularly at night.  Keep your blood sugar under control, if you have diabetes.  Keep all follow-up visits as told by your health care provider. This is important. Contact a health care provider if:  Your symptoms get worse.  Your vision affects your ability to perform daily activities.  You have new symptoms.  You have a fever. Get help right away if:  You have sudden vision loss.  You have redness, swelling, or increasing pain in your eye.  You develop a headache and sensitivity to light. This information is not intended to replace advice given to you by your health care provider. Make sure you discuss any questions you have with  your health care provider. Document Released: 08/09/2005 Document Revised: 12/18/2015 Document Reviewed: 02/12/2015 Elsevier Interactive Patient Education  2017 [employer].  Heart Failure Heart failure means your heart has trouble pumping blood. This makes it hard for your body to work well. Heart failure is usually a long-term (chronic) condition. You must take good care of yourself and follow your doctor's treatment plan. Follow these instructions at home:  Take your heart medicine as told by your doctor. ? Do not stop taking medicine unless your doctor tells you to. ? Do not skip any dose of medicine. ? Refill your medicines before they run out. ? Take other medicines only as told by your doctor or pharmacist.  Stay active if told by your doctor. The elderly and  people with severe heart failure should talk with a doctor about physical activity.  Eat heart-healthy foods. Choose foods that are without trans fat and are low in saturated fat, cholesterol, and salt (sodium). This includes fresh or frozen fruits and vegetables, fish, lean meats, fat-free or low-fat dairy foods, whole grains, and high-fiber foods. Lentils and dried peas and beans (legumes) are also good choices.  Limit salt if told by your doctor.  Cook in a healthy way. Roast, grill, broil, bake, poach, steam, or stir-fry foods.  Limit fluids as told by your doctor.  Weigh yourself every morning. Do this after you pee (urinate) and before you eat breakfast. Write down your weight to give to your doctor.  Take your blood pressure and write it down if your doctor tells you to.  Ask your doctor how to check your pulse. Check your pulse as told.  Lose weight if told by your doctor.  Stop smoking or chewing tobacco. Do not use gum or patches that help you quit without your doctor's approval.  Schedule and go to doctor visits as told.  Nonpregnant women should have no more than 1 drink a day. Men should have no more than 2 drinks a day. Talk to your doctor about drinking alcohol.  Stop illegal drug use.  Stay current with shots (immunizations).  Manage your health conditions as told by your doctor.  Learn to manage your stress.  Rest when you are tired.  If it is really hot outside: ? Avoid intense activities. ? Use air conditioning or fans, or get in a cooler place. ? Avoid caffeine and alcohol. ? Wear loose-fitting, lightweight, and light-colored clothing.  If it is really cold outside: ? Avoid intense activities. ? Layer your clothing. ? Wear mittens or gloves, a hat, and a scarf when going outside. ? Avoid alcohol.  Learn about heart failure and get support as needed.  Get help to maintain or improve your quality of life and your ability to care for yourself as  needed. Contact a doctor if:  You gain weight quickly.  You are more short of breath than usual.  You cannot do your normal activities.  You tire easily.  You cough more than normal, especially with activity.  You have any or more puffiness (swelling) in areas such as your hands, feet, ankles, or belly (abdomen).  You cannot sleep because it is hard to breathe.  You feel like your heart is beating fast (palpitations).  You get dizzy or light-headed when you stand up. Get help right away if:  You have trouble breathing.  There is a change in mental status, such as becoming less alert or not being able to focus.  You have chest pain or  discomfort.  You faint. This information is not intended to replace advice given to you by your health care provider. Make sure you discuss any questions you have with your health care provider. Document Released: 05/18/2008 Document Revised: 01/15/2016 Document Reviewed: 09/25/2012 Elsevier Interactive Patient Education  2017 [place] [employer].  Managing Your Hypertension Hypertension is commonly called high blood pressure. This is when the force of your blood pressing against the walls of your arteries is too strong. Arteries are blood vessels that carry blood from your heart throughout your body. Hypertension forces the heart to work harder to pump blood, and may cause the arteries to become narrow or stiff. Having untreated or uncontrolled hypertension can cause heart attack, stroke, kidney disease, and other problems. What are blood pressure readings? A blood pressure reading consists of a higher number over a lower number. Ideally, your blood pressure should be below 120/80. The first ("top") number is called the systolic pressure. It is a measure of the pressure in your arteries as your heart beats. The second ("bottom") number is called the diastolic pressure. It is a measure of the pressure in your arteries as the heart relaxes. What does my  blood pressure reading mean? Blood pressure is classified into four stages. Based on your blood pressure reading, your health care provider may use the following stages to determine what type of treatment you need, if any. Systolic pressure and diastolic pressure are measured in a unit called mm Hg. Normal  Systolic pressure: below 161.  Diastolic pressure: below 80. Elevated  Systolic pressure: 096-045.  Diastolic pressure: below 80. Hypertension stage 1  Systolic pressure: 409-811.  Diastolic pressure: 91-47. Hypertension stage 2  Systolic pressure: 829 or above.  Diastolic pressure: 90 or above. What health risks are associated with hypertension? Managing your hypertension is an important responsibility. Uncontrolled hypertension can lead to:  A heart attack.  A stroke.  A weakened blood vessel (aneurysm).  Heart failure.  Kidney damage.  Eye damage.  Metabolic syndrome.  Memory and concentration problems.  What changes can I make to manage my hypertension? Hypertension can be managed by making lifestyle changes and possibly by taking medicines. Your health care provider will help you make a plan to bring your blood pressure within a normal range. Eating and drinking  Eat a diet that is high in fiber and potassium, and low in salt (sodium), added sugar, and fat. An example eating plan is called the DASH (Dietary Approaches to Stop Hypertension) diet. To eat this way: ? Eat plenty of fresh fruits and vegetables. Try to fill half of your plate at each meal with fruits and vegetables. ? Eat whole grains, such as whole wheat pasta, brown rice, or whole grain bread. Fill about one quarter of your plate with whole grains. ? Eat low-fat diary products. ? Avoid fatty cuts of meat, processed or cured meats, and poultry with skin. Fill about one quarter of your plate with lean proteins such as fish, chicken without skin, beans, eggs, and tofu. ? Avoid premade and processed  foods. These tend to be higher in sodium, added sugar, and fat.  Reduce your daily sodium intake. Most people with hypertension should eat less than 1,500 mg of sodium a day.  Limit alcohol intake to no more than 1 drink a day for nonpregnant women and 2 drinks a day for men. One drink equals 12 oz of beer, 5 oz of wine, or 1 oz of hard liquor. Lifestyle  Work with your health  care provider to maintain a healthy body weight, or to lose weight. Ask what an ideal weight is for you.  Get at least 30 minutes of exercise that causes your heart to beat faster (aerobic exercise) most days of the week. Activities may include walking, swimming, or biking.  Include exercise to strengthen your muscles (resistance exercise), such as weight lifting, as part of your weekly exercise routine. Try to do these types of exercises for 30 minutes at least 3 days a week.  Do not use any products that contain nicotine or tobacco, such as cigarettes and e-cigarettes. If you need help quitting, ask your health care provider.  Control any long-term (chronic) conditions you have, such as high cholesterol or diabetes. Monitoring  Monitor your blood pressure at home as told by your health care provider. Your personal target blood pressure may vary depending on your medical conditions, your age, and other factors.  Have your blood pressure checked regularly, as often as told by your health care provider. Working with your health care provider  Review all the medicines you take with your health care provider because there may be side effects or interactions.  Talk with your health care provider about your diet, exercise habits, and other lifestyle factors that may be contributing to hypertension.  Visit your health care provider regularly. Your health care provider can help you create and adjust your plan for managing hypertension. Will I need medicine to control my blood pressure? Your health care provider may  prescribe medicine if lifestyle changes are not enough to get your blood pressure under control, and if:  Your systolic blood pressure is 130 or higher.  Your diastolic blood pressure is 80 or higher.  Take medicines only as told by your health care provider. Follow the directions carefully. Blood pressure medicines must be taken as prescribed. The medicine does not work as well when you skip doses. Skipping doses also puts you at risk for problems. Contact a health care provider if:  You think you are having a reaction to medicines you have taken.  You have repeated (recurrent) headaches.  You feel dizzy.  You have swelling in your ankles.  You have trouble with your vision. Get help right away if:  You develop a severe headache or confusion.  You have unusual weakness or numbness, or you feel faint.  You have severe pain in your chest or abdomen.  You vomit repeatedly.  You have trouble breathing. Summary  Hypertension is when the force of blood pumping through your arteries is too strong. If this condition is not controlled, it may put you at risk for serious complications.  Your personal target blood pressure may vary depending on your medical conditions, your age, and other factors. For most people, a normal blood pressure is less than 120/80.  Hypertension is managed by lifestyle changes, medicines, or both. Lifestyle changes include weight loss, eating a healthy, low-sodium diet, exercising more, and limiting alcohol. This information is not intended to replace advice given to you by your health care provider. Make sure you discuss any questions you have with your health care provider. Document Released: 05/03/2012 Document Revised: 07/07/2016 Document Reviewed: 07/07/2016 Elsevier Interactive Patient Education  [employer].

## 2017-07-04 NOTE — Progress Notes (Signed)
Patient presents to clinic today for f/u and to establish care.  SUBJECTIVE: PMH: Pt is a 46 yo with pmh sig for systolic HF, HTN, morbid obesity, former tobacco use.  Pt recently admitted to hospital on 06/26/17 -06/29/17 for CHF exacerbation.  He had 2 wks of exertional dyspnea.  BNP was elevated and CXR showed pulmonary edema and dilated LV.  Pt underwent R and L heart cath which had mod elevated LV pressure, normal coronaries.  He is to f/u with Cards in 1 wk.  Hgb A1c was pre-diabetic at 5.9%.  Patient states he is here today because he wants to live longer and his cats.  He currently has 2 cats and 84 years old each.  Patient endorses wanting to take better care of himself.  He is currently lost 20 pounds.  Since being out of the hospital he denies shortness of breath.  He initially endorsed dizziness after the second day of being home but states this and the swelling in his legs have improved.  Patient states he is not at 100% but is getting closer to each day.  Skin concerns: -Patient endorses lump in the neck times 12 years. -Progressively increasing in size.  Nonpainful. -Also endorses lump of right shoulder and 1 of right posterior neck -Also nonpainful  H/o anxiety/depression: -Pt endorses formerly being followed by therapist.   -Pt states his mother had undiagnosed behavioral health issues.  -Pt states that due to this he no longer speaks with his mother.   -Pt endorses h/o anxiety, was previously on medication but states he didn't like the fact that he cried at a [employer] commercial. 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  Pt identifies as a gay male.  Pt is a former cigarette user, he quit 200 days ago.  Pt states he would quit and restarted smoking off and on but plans to quit completely this time.  Pt denies drug use.  Pt endorses social alcohol use.  Pt states  he would like to try weed.    Family medical history: Patient completed form online (my chart) Health Maintenance: Dental --needs to see dentist. Vision --last checked in January or February 2018 Immunizations --interested in flu this visit    Past Medical History:  Diagnosis Date  . CHF (congestive heart failure) ([place])     History reviewed. No pertinent surgical history.  Current Outpatient Medications on File Prior to Visit  Medication Sig Dispense Refill  . amLODipine (NORVASC) 10 MG tablet Take 1 tablet (10 mg total) daily by mouth. 30 tablet 1  . aspirin EC 81 MG EC tablet Take 1 tablet (81 mg total) daily by mouth. 30 tablet 1  . carvedilol (COREG) 3.125 MG tablet Take 1 tablet (3.125 mg total) 2 (two) times daily with a meal by mouth. 60 tablet 1  . furosemide (LASIX) 40 MG tablet Take 1 tablet (40 mg total) daily by mouth. 30 tablet 3  . losartan (COZAAR) 25 MG tablet Take 1 tablet (25 mg total) daily by mouth. 30 tablet 3  . potassium chloride (K-DUR,KLOR-CON) 10 MEQ tablet Take 1 tablet (10 mEq total) 2 (two) times daily by mouth. 60 tablet 3   No current facility-administered medications on file prior to visit.     No Known Allergies  Family History  Problem Relation Age of Onset  . Heart disease Mother   .  HIV/AIDS Father     Social History   Socioeconomic History  . Marital status: Single    Spouse name: Not on file  . Number of children: Not on file  . Years of education: Not on file  . Highest education level: Not on file  Social Needs  . Financial resource strain: Not on file  . Food insecurity - worry: Not on file  . Food insecurity - inability: Not on file  . Transportation needs - medical: Not on file  . Transportation needs - non-medical: Not on file  Occupational History  . 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 Smoking status: Former Research scientist (life sciences)  . Smokeless tobacco: Former Network engineer and Sexual Activity  . Alcohol use: No     Frequency: Never  . Drug use: No  . Sexual activity: Not on file  Other Topics Concern  . Not on file  Social History Narrative  . Not on file    ROS General: Denies fever, chills, night sweats, changes in weight, changes in appetite  +fatigue HEENT: Denies headaches, ear pain, changes in vision, rhinorrhea, sore throat CV: Denies CP, palpitations, SOB, orthopnea Pulm: Denies SOB, cough, wheezing GI: Denies abdominal pain, nausea, vomiting, diarrhea, constipation GU: Denies dysuria, hematuria, frequency, vaginal discharge Msk: Denies muscle cramps, joint pains Neuro: Denies weakness, numbness, tingling Skin: Denies rashes, bruising Psych: Denies depression, anxiety, hallucinations  BP 110/80 (BP Location: Right Arm, Patient Position: Sitting, Cuff Size: Normal)   Pulse 95   Temp 98 F (36.7 C) (Oral)   Ht 6' 2.5" (1.892 m)   Wt (!) 305 lb 11.2 oz (138.7 kg)   BMI 38.72 kg/m   Physical Exam Gen. Pleasant, well developed, well-nourished, in NAD HEENT -wearing glasses, [place]/AT, left pupil occluded likely secondary to cataract decreased vision-light and shadows seen, right pupil reactive, no scleral icterus, no nasal drainage, pharynx without erythema or exudate.  TMs normal bilaterally, canals without cerumen Neck: No JVD, no thyromegaly, no carotid bruits Lungs: no use of accessory muscles, CTAB, no wheezes, rales or rhonchi Cardiovascular: RRR,  No r/g/m,1+ pitting edema right foot> left foot Abdomen: BS present, soft, nontender,nondistended, no hepatosplenomegaly Musculoskeletal: No deformities, moves all four extremities, no cyanosis or clubbing, normal tone Neuro:  A&Ox3, CN II-XII intact, normal gait Skin:  Warm, dry, intact.  Left anterior lateral side of the neck with large mobile mass likely a lipoma~8 cm x 5 cm.  Right posterior neck with small mobile mass approximately 3 cm x 2 cm also likely a lipoma.  Right shoulder/back with a large lipoma likely with 2  lobules. Psych: normal affect, mood appropriate  Recent Results (from the past 2160 hour(s))  CBC with Differential/Platelet     Status: None   Collection Time: 06/26/17  3:04 AM  Result Value Ref Range   WBC 8.9 4.0 - 10.5 K/uL   RBC 4.73 4.22 - 5.81 MIL/uL   Hemoglobin 14.1 13.0 - 17.0 g/dL   HCT 41.4 39.0 - 52.0 %   MCV 87.5 78.0 - 100.0 fL   MCH 29.8 26.0 - 34.0 pg   MCHC 34.1 30.0 - 36.0 g/dL   RDW 14.1 11.5 - 15.5 %   Platelets 290 150 - 400 K/uL   Neutrophils Relative % 48 %   Neutro Abs 4.4 1.7 - 7.7 K/uL   Lymphocytes Relative 38 %   Lymphs Abs 3.4 0.7 - 4.0 K/uL   Monocytes Relative 9 %   Monocytes Absolute 0.8 0.1 -  1.0 K/uL   Eosinophils Relative 4 %   Eosinophils Absolute 0.3 0.0 - 0.7 K/uL   Basophils Relative 1 %   Basophils Absolute 0.1 0.0 - 0.1 K/uL  Comprehensive metabolic panel     Status: Abnormal   Collection Time: 06/26/17  3:04 AM  Result Value Ref Range   Sodium 139 135 - 145 mmol/L   Potassium 3.6 3.5 - 5.1 mmol/L   Chloride 104 101 - 111 mmol/L   CO2 25 22 - 32 mmol/L   Glucose, Bld 119 (H) 65 - 99 mg/dL   BUN 24 (H) 6 - 20 mg/dL   Creatinine, Ser 1.26 (H) 0.61 - 1.24 mg/dL   Calcium 9.2 8.9 - 10.3 mg/dL   Total Protein 7.1 6.5 - 8.1 g/dL   Albumin 3.8 3.5 - 5.0 g/dL   AST 31 15 - 41 U/L   ALT 57 17 - 63 U/L   Alkaline Phosphatase 46 38 - 126 U/L   Total Bilirubin 0.5 0.3 - 1.2 mg/dL   GFR calc non Af Amer >60 >60 mL/min   GFR calc Af Amer >60 >60 mL/min    Comment: (NOTE) The eGFR has been calculated using the CKD EPI equation. This calculation has not been validated in all clinical situations. eGFR's persistently <60 mL/min signify possible Chronic Kidney Disease.    Anion gap 10 5 - 15  Brain natriuretic peptide     Status: Abnormal   Collection Time: 06/26/17  3:40 AM  Result Value Ref Range   B Natriuretic Peptide 255.4 (H) 0.0 - 100.0 pg/mL  CG4 I-STAT (Lactic acid)     Status: None   Collection Time: 06/26/17  3:43 AM  Result  Value Ref Range   Lactic Acid, Venous 1.86 0.5 - 1.9 mmol/L  POCT i-Stat troponin I     Status: None   Collection Time: 06/26/17  4:24 AM  Result Value Ref Range   Troponin i, poc 0.04 0.00 - 0.08 ng/mL   Comment 3            Comment: Due to the release kinetics of cTnI, a negative result within the first hours of the onset of symptoms does not rule out myocardial infarction with certainty. If myocardial infarction is still suspected, repeat the test at appropriate intervals.   HIV antibody (Routine Testing)     Status: None   Collection Time: 06/26/17  8:52 AM  Result Value Ref Range   HIV Screen 4th Generation wRfx Non Reactive Non Reactive    Comment: (NOTE) Performed At: [facility] [place], [place] [id number] Rush Farmer MD [id number]   CBC     Status: Abnormal   Collection Time: 06/26/17  8:52 AM  Result Value Ref Range   WBC 11.3 (H) 4.0 - 10.5 K/uL   RBC 4.87 4.22 - 5.81 MIL/uL   Hemoglobin 14.8 13.0 - 17.0 g/dL   HCT 42.8 39.0 - 52.0 %   MCV 87.9 78.0 - 100.0 fL   MCH 30.4 26.0 - 34.0 pg   MCHC 34.6 30.0 - 36.0 g/dL   RDW 14.1 11.5 - 15.5 %   Platelets 284 150 - 400 K/uL  Creatinine, serum     Status: Abnormal   Collection Time: 06/26/17  8:52 AM  Result Value Ref Range   Creatinine, Ser 1.35 (H) 0.61 - 1.24 mg/dL   GFR calc non Af Amer >60 >60 mL/min   GFR calc Af Amer >60 >60 mL/min  Comment: (NOTE) The eGFR has been calculated using the CKD EPI equation. This calculation has not been validated in all clinical situations. eGFR's persistently <60 mL/min signify possible Chronic Kidney Disease.   Glucose, capillary     Status: Abnormal   Collection Time: 06/26/17 11:59 AM  Result Value Ref Range   Glucose-Capillary 110 (H) 65 - 99 mg/dL   Comment 1 Notify RN    Comment 2 Document in Chart   Urinalysis, Routine w reflex microscopic     Status: Abnormal   Collection Time: 06/26/17  1:27 PM  Result Value Ref Range   Color,  Urine STRAW (A) YELLOW   APPearance CLEAR CLEAR   Specific Gravity, Urine 1.011 1.005 - 1.030   pH 7.0 5.0 - 8.0   Glucose, UA NEGATIVE NEGATIVE mg/dL   Hgb urine dipstick NEGATIVE NEGATIVE   Bilirubin Urine NEGATIVE NEGATIVE   Ketones, ur NEGATIVE NEGATIVE mg/dL   Protein, ur NEGATIVE NEGATIVE mg/dL   Nitrite NEGATIVE NEGATIVE   Leukocytes, UA NEGATIVE NEGATIVE  Glucose, capillary     Status: Abnormal   Collection Time: 06/26/17  4:35 PM  Result Value Ref Range   Glucose-Capillary 100 (H) 65 - 99 mg/dL   Comment 1 Notify RN    Comment 2 Document in Chart   Glucose, capillary     Status: Abnormal   Collection Time: 06/26/17  9:07 PM  Result Value Ref Range   Glucose-Capillary 105 (H) 65 - 99 mg/dL  Basic metabolic panel     Status: Abnormal   Collection Time: 06/27/17  3:10 AM  Result Value Ref Range   Sodium 141 135 - 145 mmol/L   Potassium 4.2 3.5 - 5.1 mmol/L   Chloride 102 101 - 111 mmol/L   CO2 29 22 - 32 mmol/L   Glucose, Bld 118 (H) 65 - 99 mg/dL   BUN 23 (H) 6 - 20 mg/dL   Creatinine, Ser 1.32 (H) 0.61 - 1.24 mg/dL   Calcium 9.2 8.9 - 10.3 mg/dL   GFR calc non Af Amer >60 >60 mL/min   GFR calc Af Amer >60 >60 mL/min    Comment: (NOTE) The eGFR has been calculated using the CKD EPI equation. This calculation has not been validated in all clinical situations. eGFR's persistently <60 mL/min signify possible Chronic Kidney Disease.    Anion gap 10 5 - 15  Magnesium     Status: None   Collection Time: 06/27/17  3:10 AM  Result Value Ref Range   Magnesium 2.3 1.7 - 2.4 mg/dL  Glucose, capillary     Status: Abnormal   Collection Time: 06/27/17  7:47 AM  Result Value Ref Range   Glucose-Capillary 104 (H) 65 - 99 mg/dL   Comment 1 Notify RN    Comment 2 Document in Chart   Hemoglobin A1c     Status: Abnormal   Collection Time: 06/27/17  9:11 AM  Result Value Ref Range   Hgb A1c MFr Bld 5.9 (H) 4.8 - 5.6 %    Comment: (NOTE) Pre diabetes:           5.7%-6.4% Diabetes:              >6.4% Glycemic control for   <7.0% adults with diabetes    Mean Plasma Glucose 122.63 mg/dL  TSH     Status: None   Collection Time: 06/27/17  9:11 AM  Result Value Ref Range   TSH 3.123 0.350 - 4.500 uIU/mL    Comment: Performed  by a 3rd Generation assay with a functional sensitivity of <=0.01 uIU/mL.  Glucose, capillary     Status: None   Collection Time: 06/27/17 11:44 AM  Result Value Ref Range   Glucose-Capillary 85 65 - 99 mg/dL   Comment 1 Notify RN    Comment 2 Document in Chart   ECHOCARDIOGRAM COMPLETE     Status: None   Collection Time: 06/27/17 12:45 PM  Result Value Ref Range   Weight 5,123.2 oz   Height 76 in   BP 131/82 mmHg  Basic metabolic panel     Status: Abnormal   Collection Time: 06/28/17  5:17 AM  Result Value Ref Range   Sodium 139 135 - 145 mmol/L   Potassium 4.0 3.5 - 5.1 mmol/L   Chloride 104 101 - 111 mmol/L   CO2 27 22 - 32 mmol/L   Glucose, Bld 108 (H) 65 - 99 mg/dL   BUN 22 (H) 6 - 20 mg/dL   Creatinine, Ser 1.17 0.61 - 1.24 mg/dL   Calcium 8.8 (L) 8.9 - 10.3 mg/dL   GFR calc non Af Amer >60 >60 mL/min   GFR calc Af Amer >60 >60 mL/min    Comment: (NOTE) The eGFR has been calculated using the CKD EPI equation. This calculation has not been validated in all clinical situations. eGFR's persistently <60 mL/min signify possible Chronic Kidney Disease.    Anion gap 8 5 - 15  CBC     Status: None   Collection Time: 06/28/17  5:17 AM  Result Value Ref Range   WBC 9.4 4.0 - 10.5 K/uL   RBC 4.61 4.22 - 5.81 MIL/uL   Hemoglobin 13.6 13.0 - 17.0 g/dL   HCT 40.6 39.0 - 52.0 %   MCV 88.1 78.0 - 100.0 fL   MCH 29.5 26.0 - 34.0 pg   MCHC 33.5 30.0 - 36.0 g/dL   RDW 14.5 11.5 - 15.5 %   Platelets 250 150 - 400 K/uL  Protime-INR     Status: None   Collection Time: 06/28/17  5:17 AM  Result Value Ref Range   Prothrombin Time 13.6 11.4 - 15.2 seconds   INR 1.05   POCT Activated clotting time     Status: None    Collection Time: 06/28/17  1:16 PM  Result Value Ref Range   Activated Clotting Time 125 seconds  I-STAT 3, venous blood gas (G3P V)     Status: Abnormal   Collection Time: 06/28/17  1:21 PM  Result Value Ref Range   pH, Ven 7.425 7.250 - 7.430   pCO2, Ven 44.1 44.0 - 60.0 mmHg   pO2, Ven 35.0 32.0 - 45.0 mmHg   Bicarbonate 29.0 (H) 20.0 - 28.0 mmol/L   TCO2 30 22 - 32 mmol/L   O2 Saturation 68.0 %   Acid-Base Excess 4.0 (H) 0.0 - 2.0 mmol/L   Patient temperature HIDE    Sample type VENOUS    Comment NOTIFIED PHYSICIAN   I-STAT 3, arterial blood gas (G3+)     Status: Abnormal   Collection Time: 06/28/17  1:47 PM  Result Value Ref Range   pH, Arterial 7.450 7.350 - 7.450   pCO2 arterial 38.9 32.0 - 48.0 mmHg   pO2, Arterial 89.0 83.0 - 108.0 mmHg   Bicarbonate 27.0 20.0 - 28.0 mmol/L   TCO2 28 22 - 32 mmol/L   O2 Saturation 97.0 %   Acid-Base Excess 3.0 (H) 0.0 - 2.0 mmol/L   Patient temperature HIDE  Sample type ARTERIAL   POCT Activated clotting time     Status: None   Collection Time: 06/28/17  1:58 PM  Result Value Ref Range   Activated Clotting Time 136 seconds  Basic metabolic panel     Status: Abnormal   Collection Time: 06/29/17  4:14 AM  Result Value Ref Range   Sodium 138 135 - 145 mmol/L   Potassium 3.5 3.5 - 5.1 mmol/L   Chloride 102 101 - 111 mmol/L   CO2 27 22 - 32 mmol/L   Glucose, Bld 108 (H) 65 - 99 mg/dL   BUN 24 (H) 6 - 20 mg/dL   Creatinine, Ser 1.30 (H) 0.61 - 1.24 mg/dL   Calcium 8.8 (L) 8.9 - 10.3 mg/dL   GFR calc non Af Amer >60 >60 mL/min   GFR calc Af Amer >60 >60 mL/min    Comment: (NOTE) The eGFR has been calculated using the CKD EPI equation. This calculation has not been validated in all clinical situations. eGFR's persistently <60 mL/min signify possible Chronic Kidney Disease.    Anion gap 9 5 - 15  CBC     Status: None   Collection Time: 06/29/17  4:14 AM  Result Value Ref Range   WBC 9.6 4.0 - 10.5 K/uL   RBC 4.61 4.22 - 5.81  MIL/uL   Hemoglobin 13.6 13.0 - 17.0 g/dL   HCT 40.6 39.0 - 52.0 %   MCV 88.1 78.0 - 100.0 fL   MCH 29.5 26.0 - 34.0 pg   MCHC 33.5 30.0 - 36.0 g/dL   RDW 14.3 11.5 - 15.5 %   Platelets 257 150 - 400 K/uL  Lipid panel     Status: Abnormal   Collection Time: 06/29/17  1:00 PM  Result Value Ref Range   Cholesterol 172 0 - 200 mg/dL   Triglycerides 126 <150 mg/dL   HDL 31 (L) >40 mg/dL   Total CHOL/HDL Ratio 5.5 RATIO   VLDL 25 0 - 40 mg/dL   LDL Cholesterol 116 (H) 0 - 99 mg/dL    Comment:        Total Cholesterol/HDL:CHD Risk Coronary Heart Disease Risk Table                     Men   Women  1/2 Average Risk   3.4   3.3  Average Risk       5.0   4.4  2 X Average Risk   9.6   7.1  3 X Average Risk  23.4   11.0        Use the calculated Patient Ratio above and the CHD Risk Table to determine the patient's CHD Risk.        ATP III CLASSIFICATION (LDL):  <100     mg/dL   Optimal  100-129  mg/dL   Near or Above                    Optimal  130-159  mg/dL   Borderline  160-189  mg/dL   High  >190     mg/dL   Very High   Basic metabolic panel     Status: Abnormal   Collection Time: 07/04/17 11:11 AM  Result Value Ref Range   Sodium 139 135 - 145 mEq/L   Potassium 5.2 (H) 3.5 - 5.1 mEq/L   Chloride 99 96 - 112 mEq/L   CO2 30 19 - 32 mEq/L   Glucose, Bld 99 70 -  99 mg/dL   BUN 27 (H) 6 - 23 mg/dL   Creatinine, Ser 1.40 0.40 - 1.50 mg/dL   Calcium 10.3 8.4 - 10.5 mg/dL   GFR 57.96 (L) >60.00 mL/min    Assessment/Plan: Acute systolic congestive heart failure (HCC)  -Patient encouraged to weigh himself daily -The importance of lifestyle modification stressed -Patient encouraged to follow-up with cardiology consistently -Patient given handout - Plan: Basic metabolic panel  Essential hypertension -Controlled -Continue Norvasc 10 mg, Coreg 3.125 mg, Lasix 40, losartan 25 mg, and K Dur 10 mEq -We will obtain BMP -Discussed importance of lifestyle modifications -Patient  encouraged to check BP at home and keep a log. -Patient given handout  Obesity (BMI 35.0-39.9 without comorbidity) -Discussed need for weight loss -Patient encouraged to increase physical activity, increase water intake, make dietary changes. -Patient seems open and ready for change. -Given handout and book on dietary info for area restaurants  Pre-diabetes -Hemoglobin A1c 5.9 on 06/27/17 -Discussed weight loss, dietary changes to help reduce this number  Need for influenza vaccination  - Plan: Flu Vaccine QUAD 36+ mos IM  Cataract of left eye, unspecified cataract type  -Discussed need to have further evaluation -Patient okay with referral to Ophtho, however would like to wait until the new year to not take any more days off of work - Plan: Ambulatory referral to Ophthalmology  Lipoma of back -Given the size of lesions will refer out for removal. -Referred to dermatology however may refer to plastics given the number of areas that need to be removed  - Plan: Ambulatory referral to Dermatology  Lipoma of neck -See above -Referred to dermatology however may need referral to plastics for removal given the location and other problems.  Establish care -We reviewed the PMH, PSH, FH, SH, Meds and Allergies. -We provided refills for any medications we will prescribe as needed. -We addressed current concerns per orders and patient instructions. -We have asked for records for pertinent exams, studies, vaccines and notes from previous providers. -We have advised patient to follow up per instructions below.  F/u in next 4-6 wks.

## 2017-07-05 ENCOUNTER — Encounter: Payer: Self-pay | Admitting: Family Medicine

## 2017-07-05 NOTE — Telephone Encounter (Signed)
Patient called to ask for a return to work note. His employer is requesting this today so that he can work.  Pt asked for letter to be faxed to him [phone]

## 2017-07-05 NOTE — Telephone Encounter (Signed)
Pt is at work, needs return to work faxed so that he may start his shift. Please fax to [phone]. cb number for pt is [phone]

## 2017-07-05 NOTE — Telephone Encounter (Signed)
Pt is aware must see cardiologist to get work note

## 2018-11-21 IMAGING — CR DG CHEST 2V
2 series · 2 of 2 positions shown · non-contrast
Comparison: None.

CLINICAL DATA: Shortness of breath.

EXAM:
CHEST  2 VIEW

[chest pa]
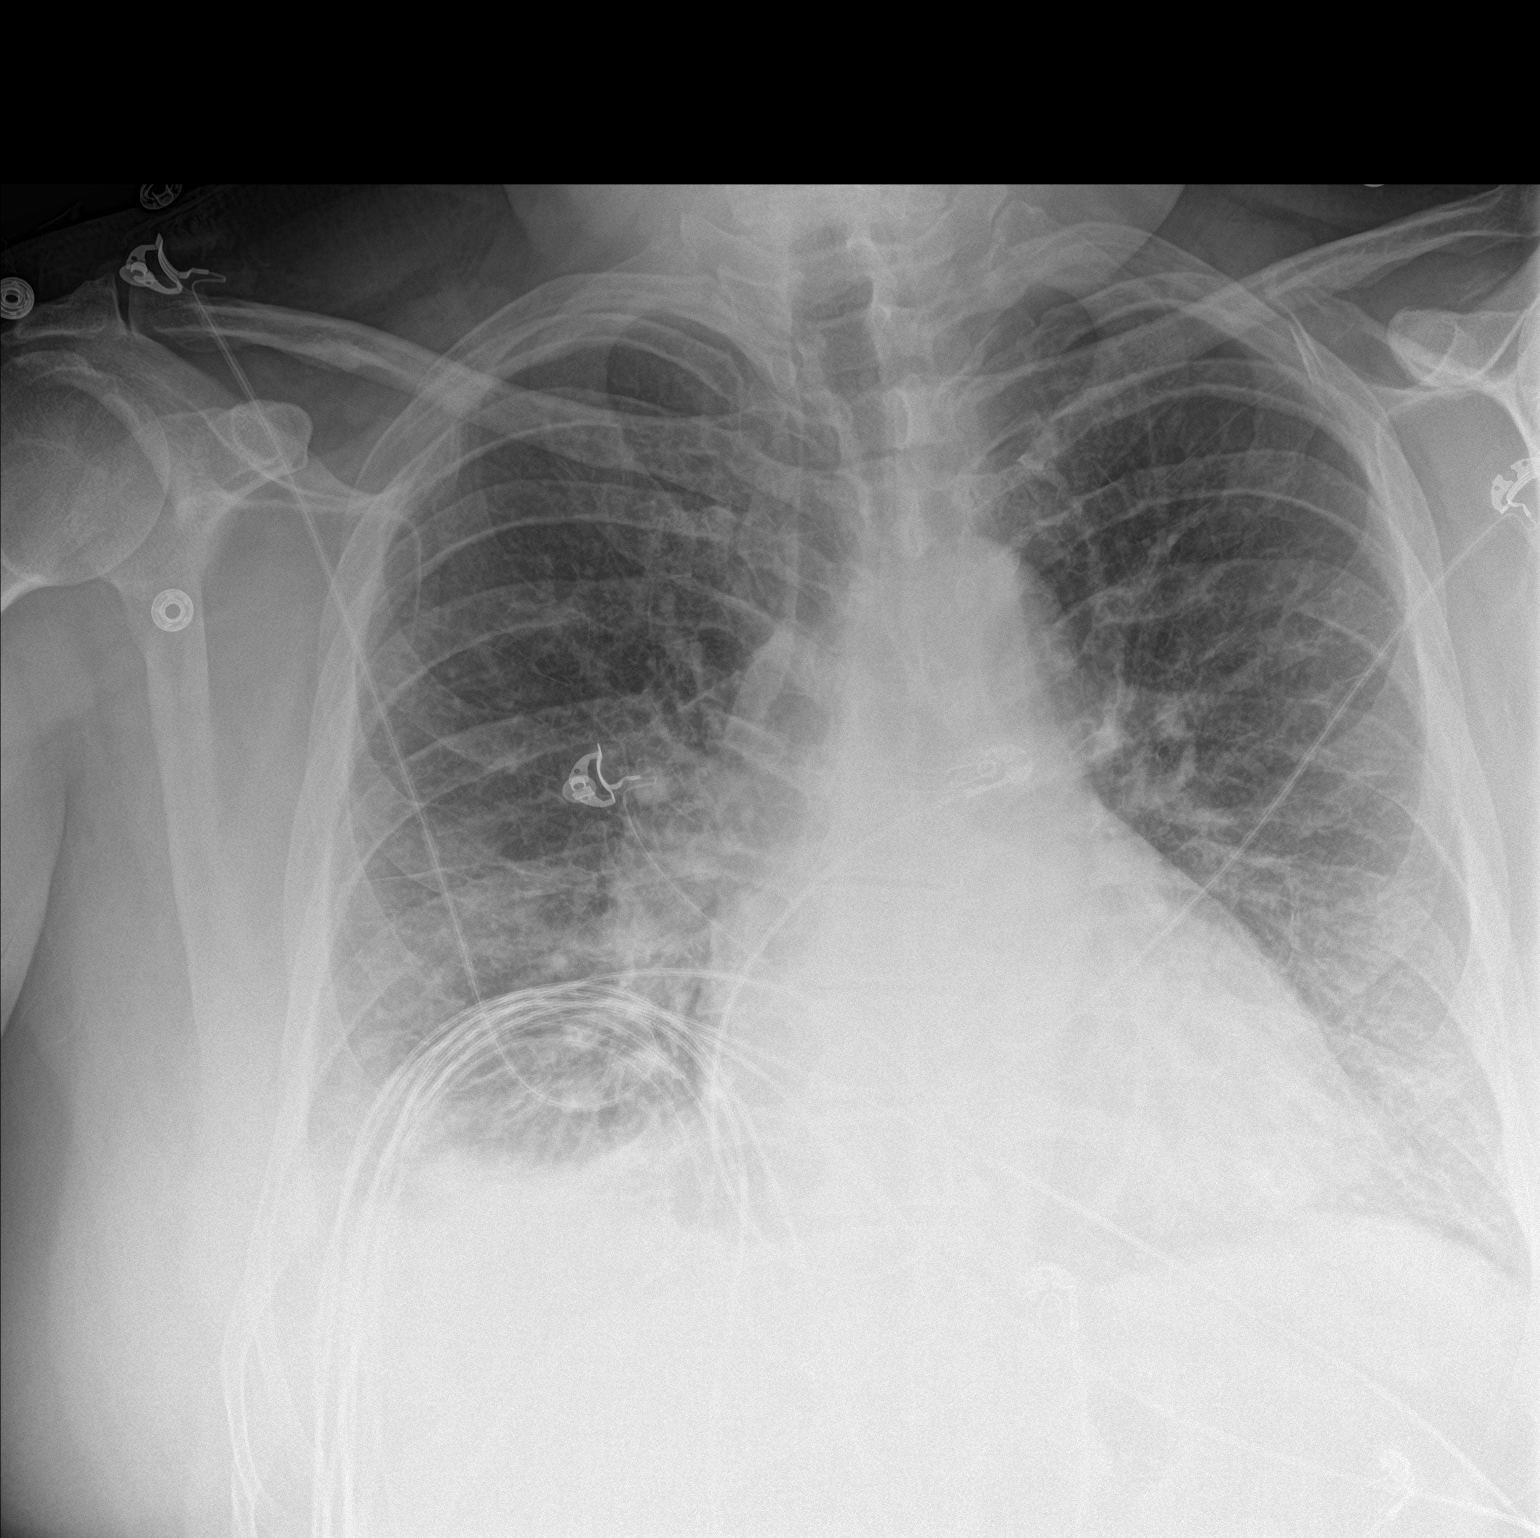

[chest lat]
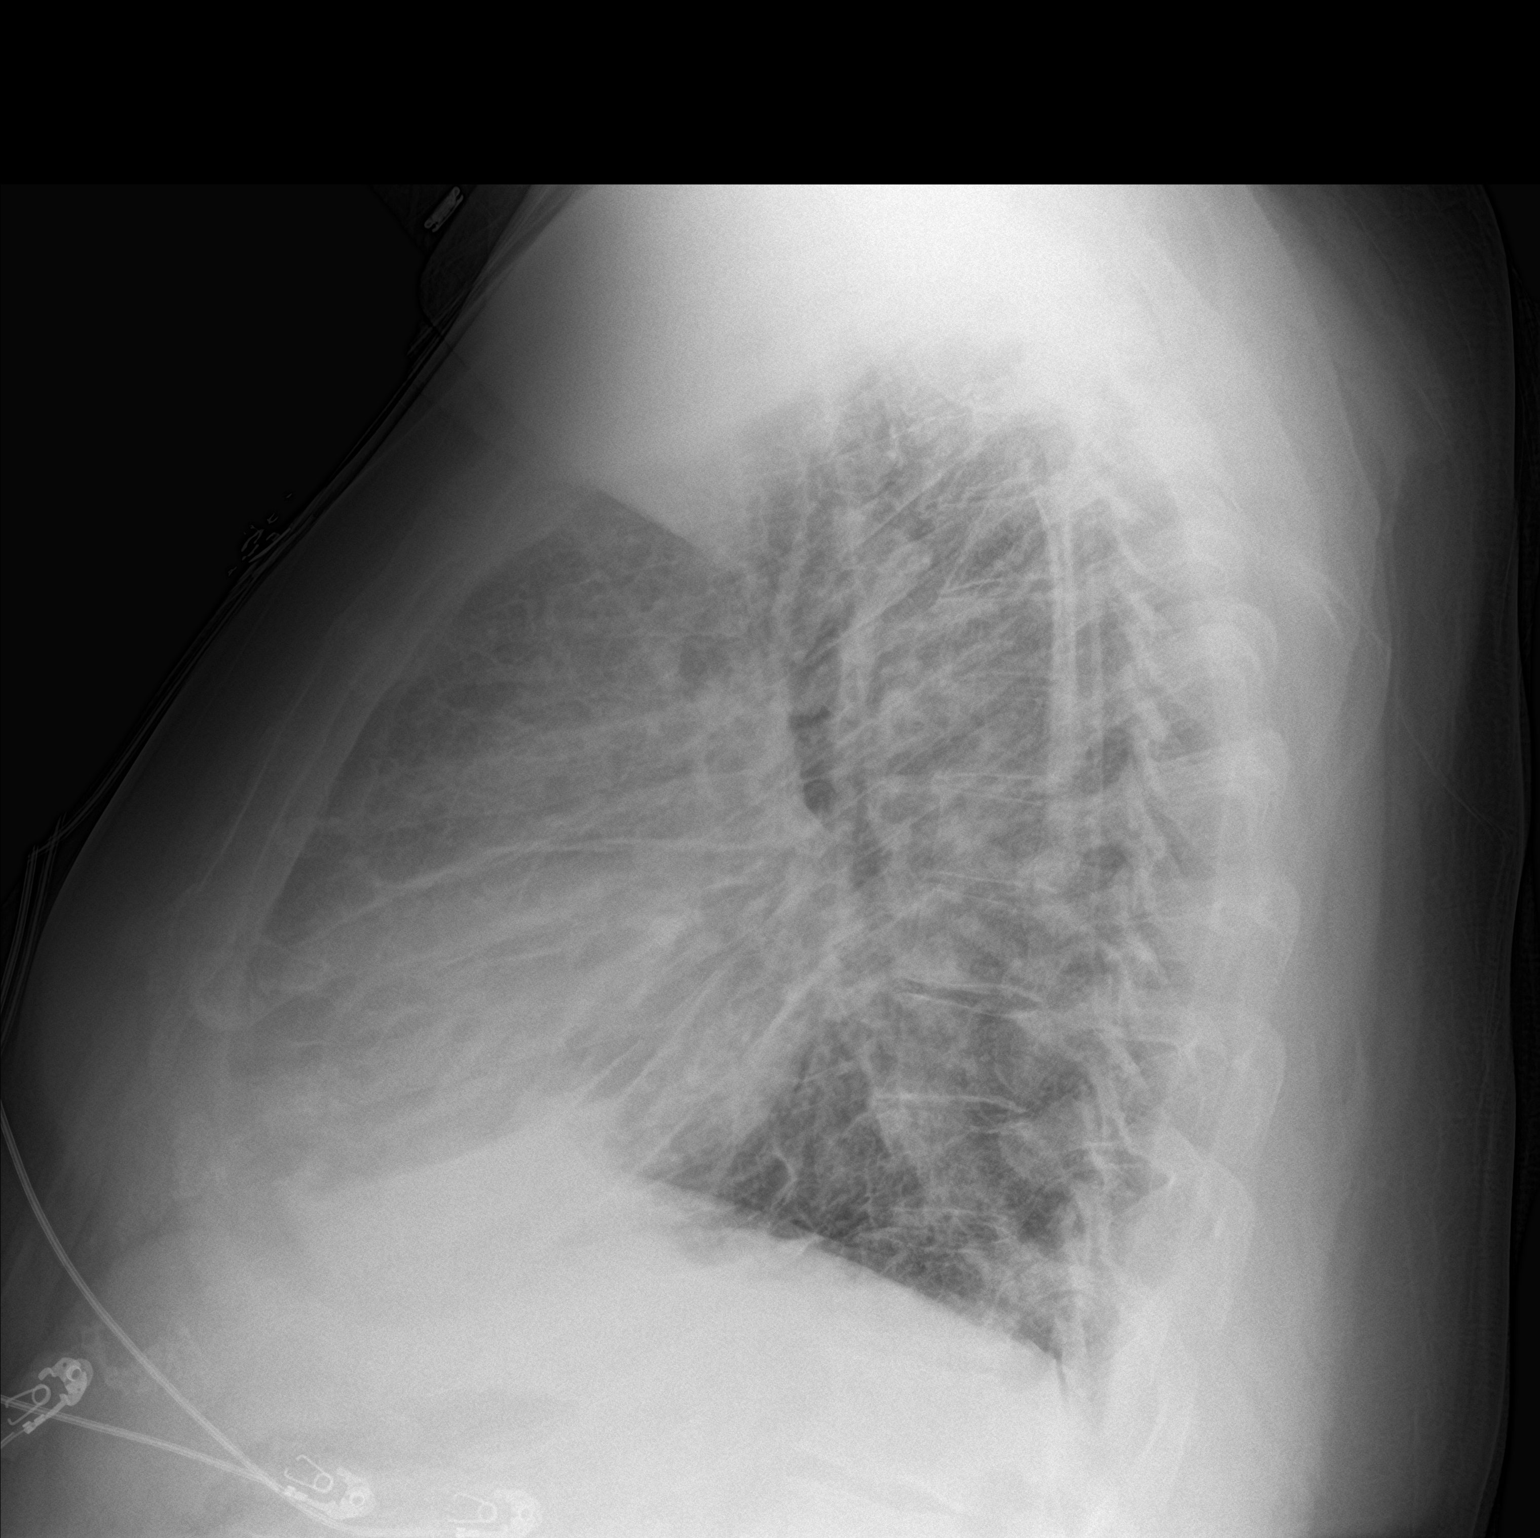

[2 of 2 positions shown; findings below may reference images not displayed]

FINDINGS: Mild cardiomegaly. Moderate pulmonary edema and bronchial
thickening. Small right pleural effusion. No confluent airspace
disease. No pneumothorax. No acute osseous abnormalities.
IMPRESSION: Cardiomegaly with pulmonary edema and small right pleural effusion
consistent with CHF.

## 2018-11-22 IMAGING — DX DG CHEST 1V PORT
1 series · 1 of 1 positions shown · non-contrast
Comparison: Radiographs June 26, 2017.

CLINICAL DATA: Shortness of breath.

EXAM:
PORTABLE CHEST 1 VIEW

[chest]
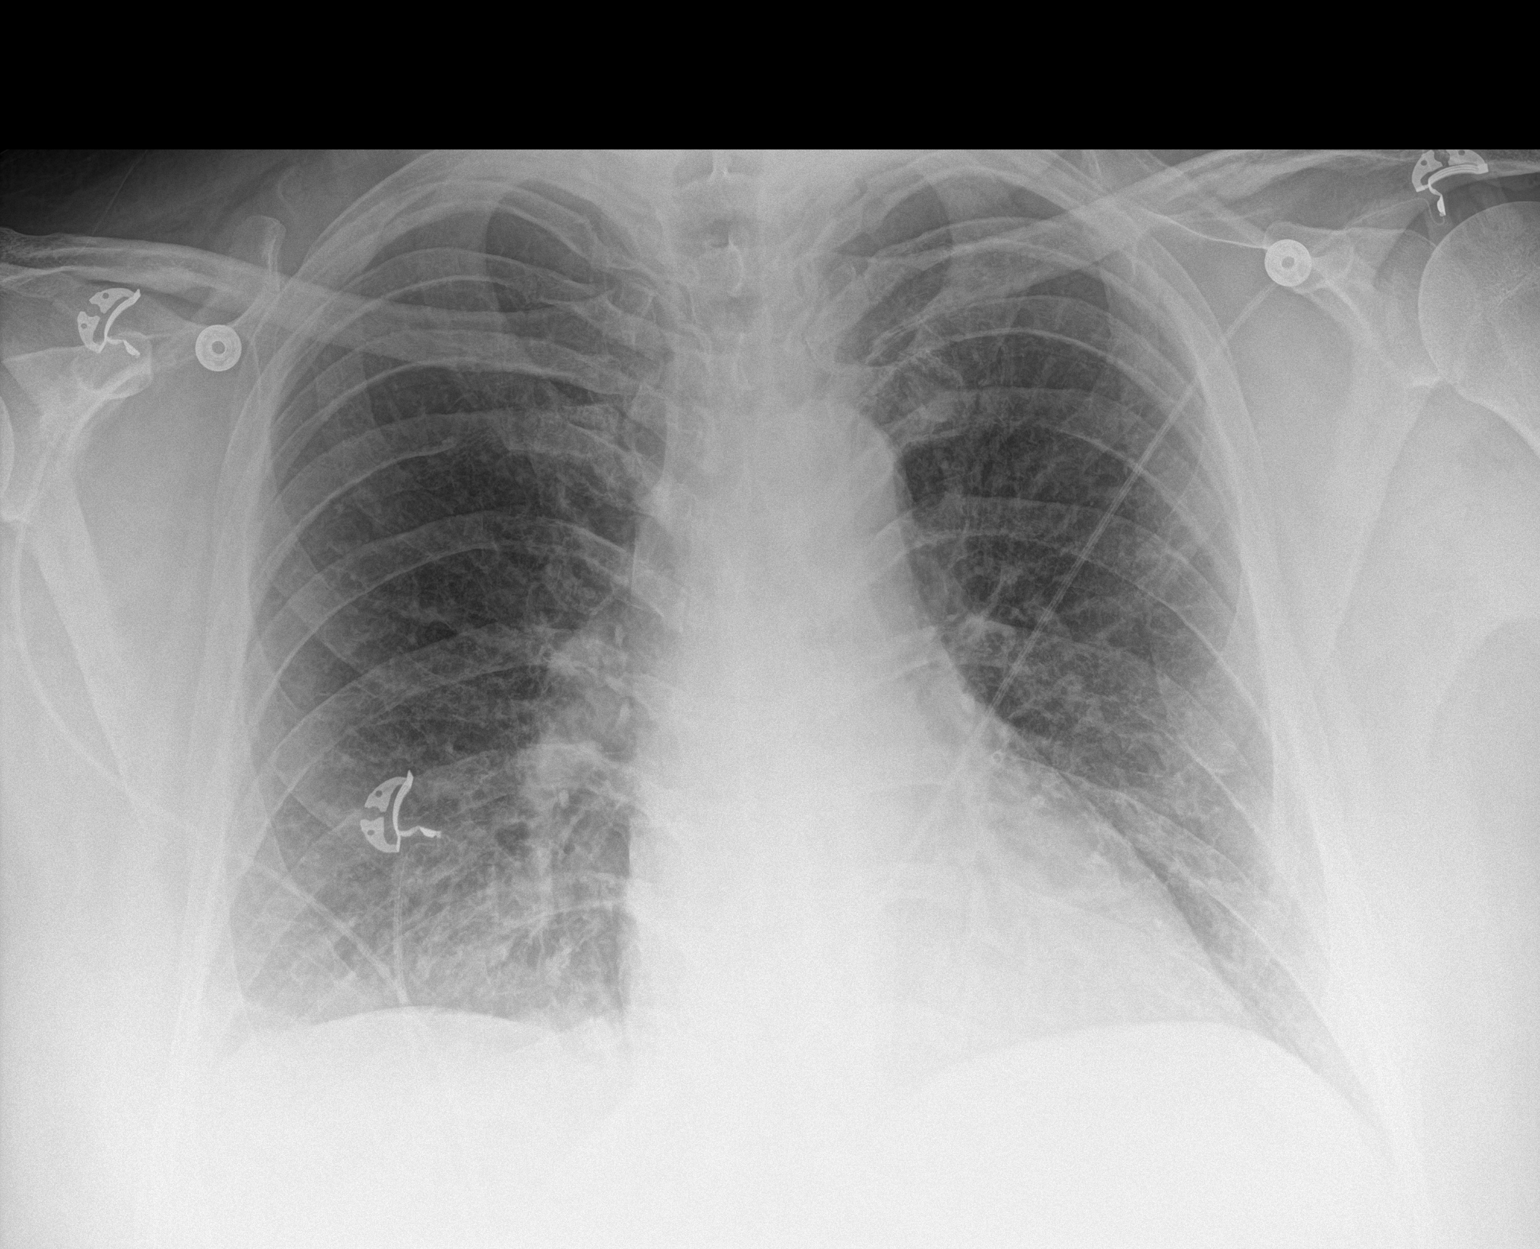

[1 of 1 positions shown; findings below may reference images not displayed]

FINDINGS: The heart size and mediastinal contours are within normal limits. No
pneumothorax or pleural effusion is noted. Mild bilateral pulmonary
edema is noted which is improved compared to prior exam. The
visualized skeletal structures are unremarkable.
IMPRESSION: Improved bilateral pulmonary edema.
